# Patient Record
Sex: Female | Born: 1965 | Race: Black or African American | Hispanic: No | Marital: Married | Smoking: Never smoker
Health system: Southern US, Community
[De-identification: ages and names within clinical notes are randomized; demographics above are authoritative.]

## PROBLEM LIST (undated history)

## (undated) DIAGNOSIS — Z87898 Personal history of other specified conditions: Secondary | ICD-10-CM

## (undated) DIAGNOSIS — I38 Endocarditis, valve unspecified: Secondary | ICD-10-CM

## (undated) DIAGNOSIS — N159 Renal tubulo-interstitial disease, unspecified: Secondary | ICD-10-CM

## (undated) DIAGNOSIS — Q210 Ventricular septal defect: Secondary | ICD-10-CM

## (undated) DIAGNOSIS — E7800 Pure hypercholesterolemia, unspecified: Secondary | ICD-10-CM

## (undated) DIAGNOSIS — Z952 Presence of prosthetic heart valve: Secondary | ICD-10-CM

## (undated) DIAGNOSIS — N200 Calculus of kidney: Secondary | ICD-10-CM

## (undated) DIAGNOSIS — E785 Hyperlipidemia, unspecified: Secondary | ICD-10-CM

## (undated) DIAGNOSIS — R87629 Unspecified abnormal cytological findings in specimens from vagina: Secondary | ICD-10-CM

## (undated) HISTORY — DX: Presence of prosthetic heart valve: Z95.2

## (undated) HISTORY — DX: Pure hypercholesterolemia, unspecified: E78.00

## (undated) HISTORY — DX: Unspecified abnormal cytological findings in specimens from vagina: R87.629

## (undated) HISTORY — DX: Personal history of other specified conditions: Z87.898

## (undated) HISTORY — DX: Ventricular septal defect: Q21.0

## (undated) HISTORY — DX: Hyperlipidemia, unspecified: E78.5

## (undated) HISTORY — DX: Endocarditis, valve unspecified: I38

---
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
## 1997-05-21 ENCOUNTER — Encounter (HOSPITAL_COMMUNITY): Admission: RE | Admit: 1997-05-21 | Discharge: 1997-08-19 | Payer: Self-pay | Admitting: Cardiology

## 1997-09-02 ENCOUNTER — Encounter (HOSPITAL_COMMUNITY): Admission: RE | Admit: 1997-09-02 | Discharge: 1997-12-01 | Payer: Self-pay | Admitting: Cardiology

## 1997-11-20 ENCOUNTER — Encounter: Admission: RE | Admit: 1997-11-20 | Discharge: 1997-11-20 | Payer: Self-pay | Admitting: Family Medicine

## 1998-03-07 ENCOUNTER — Emergency Department (HOSPITAL_COMMUNITY): Admission: EM | Admit: 1998-03-07 | Discharge: 1998-03-07 | Payer: Self-pay | Admitting: Emergency Medicine

## 1998-04-10 ENCOUNTER — Emergency Department (HOSPITAL_COMMUNITY): Admission: EM | Admit: 1998-04-10 | Discharge: 1998-04-10 | Payer: Self-pay | Admitting: Emergency Medicine

## 1998-07-04 ENCOUNTER — Encounter: Payer: Self-pay | Admitting: Emergency Medicine

## 1998-07-04 ENCOUNTER — Emergency Department (HOSPITAL_COMMUNITY): Admission: EM | Admit: 1998-07-04 | Discharge: 1998-07-04 | Payer: Self-pay | Admitting: Emergency Medicine

## 1998-11-14 ENCOUNTER — Emergency Department (HOSPITAL_COMMUNITY): Admission: EM | Admit: 1998-11-14 | Discharge: 1998-11-14 | Payer: Self-pay | Admitting: Emergency Medicine

## 1998-12-14 ENCOUNTER — Encounter: Payer: Self-pay | Admitting: Emergency Medicine

## 1998-12-14 ENCOUNTER — Emergency Department (HOSPITAL_COMMUNITY): Admission: EM | Admit: 1998-12-14 | Discharge: 1998-12-15 | Payer: Self-pay | Admitting: Emergency Medicine

## 1999-01-27 ENCOUNTER — Encounter: Admission: RE | Admit: 1999-01-27 | Discharge: 1999-01-27 | Payer: Self-pay | Admitting: Family Medicine

## 1999-02-03 ENCOUNTER — Emergency Department (HOSPITAL_COMMUNITY): Admission: EM | Admit: 1999-02-03 | Discharge: 1999-02-04 | Payer: Self-pay

## 1999-02-10 ENCOUNTER — Encounter: Admission: RE | Admit: 1999-02-10 | Discharge: 1999-02-10 | Payer: Self-pay | Admitting: Sports Medicine

## 1999-05-23 ENCOUNTER — Emergency Department (HOSPITAL_COMMUNITY): Admission: EM | Admit: 1999-05-23 | Discharge: 1999-05-23 | Payer: Self-pay | Admitting: Emergency Medicine

## 1999-07-08 ENCOUNTER — Emergency Department (HOSPITAL_COMMUNITY): Admission: EM | Admit: 1999-07-08 | Discharge: 1999-07-08 | Payer: Self-pay | Admitting: Emergency Medicine

## 2000-01-06 ENCOUNTER — Emergency Department (HOSPITAL_COMMUNITY): Admission: EM | Admit: 2000-01-06 | Discharge: 2000-01-06 | Payer: Self-pay | Admitting: Emergency Medicine

## 2000-02-09 ENCOUNTER — Encounter: Admission: RE | Admit: 2000-02-09 | Discharge: 2000-02-09 | Payer: Self-pay | Admitting: Family Medicine

## 2000-04-20 ENCOUNTER — Encounter: Admission: RE | Admit: 2000-04-20 | Discharge: 2000-04-20 | Payer: Self-pay | Admitting: Family Medicine

## 2000-04-27 ENCOUNTER — Encounter: Admission: RE | Admit: 2000-04-27 | Discharge: 2000-04-27 | Payer: Self-pay | Admitting: Family Medicine

## 2000-05-18 ENCOUNTER — Encounter: Admission: RE | Admit: 2000-05-18 | Discharge: 2000-05-18 | Payer: Self-pay | Admitting: Family Medicine

## 2000-06-27 ENCOUNTER — Encounter: Admission: RE | Admit: 2000-06-27 | Discharge: 2000-06-27 | Payer: Self-pay | Admitting: Family Medicine

## 2001-06-30 ENCOUNTER — Emergency Department (HOSPITAL_COMMUNITY): Admission: EM | Admit: 2001-06-30 | Discharge: 2001-06-30 | Payer: Self-pay | Admitting: Emergency Medicine

## 2001-11-24 ENCOUNTER — Inpatient Hospital Stay (HOSPITAL_COMMUNITY): Admission: EM | Admit: 2001-11-24 | Discharge: 2001-11-26 | Payer: Self-pay | Admitting: Emergency Medicine

## 2001-12-01 ENCOUNTER — Emergency Department (HOSPITAL_COMMUNITY): Admission: EM | Admit: 2001-12-01 | Discharge: 2001-12-01 | Payer: Self-pay | Admitting: Emergency Medicine

## 2003-03-23 ENCOUNTER — Emergency Department (HOSPITAL_COMMUNITY): Admission: EM | Admit: 2003-03-23 | Discharge: 2003-03-23 | Payer: Self-pay | Admitting: Emergency Medicine

## 2003-07-12 ENCOUNTER — Emergency Department (HOSPITAL_COMMUNITY): Admission: EM | Admit: 2003-07-12 | Discharge: 2003-07-12 | Payer: Self-pay | Admitting: Emergency Medicine

## 2003-07-27 ENCOUNTER — Emergency Department (HOSPITAL_COMMUNITY): Admission: EM | Admit: 2003-07-27 | Discharge: 2003-07-27 | Payer: Self-pay | Admitting: Emergency Medicine

## 2003-07-28 ENCOUNTER — Emergency Department (HOSPITAL_COMMUNITY): Admission: EM | Admit: 2003-07-28 | Discharge: 2003-07-28 | Payer: Self-pay | Admitting: Emergency Medicine

## 2004-02-23 DIAGNOSIS — I38 Endocarditis, valve unspecified: Secondary | ICD-10-CM

## 2004-02-23 HISTORY — DX: Endocarditis, valve unspecified: I38

## 2004-07-07 ENCOUNTER — Ambulatory Visit: Payer: Self-pay | Admitting: Family Medicine

## 2004-07-07 ENCOUNTER — Other Ambulatory Visit: Admission: RE | Admit: 2004-07-07 | Discharge: 2004-07-07 | Payer: Self-pay | Admitting: Family Medicine

## 2004-09-06 ENCOUNTER — Emergency Department (HOSPITAL_COMMUNITY): Admission: EM | Admit: 2004-09-06 | Discharge: 2004-09-06 | Payer: Self-pay | Admitting: *Deleted

## 2004-10-09 ENCOUNTER — Ambulatory Visit: Payer: Self-pay | Admitting: Family Medicine

## 2004-11-13 ENCOUNTER — Emergency Department (HOSPITAL_COMMUNITY): Admission: EM | Admit: 2004-11-13 | Discharge: 2004-11-13 | Payer: Self-pay | Admitting: Family Medicine

## 2004-11-19 ENCOUNTER — Ambulatory Visit: Payer: Self-pay | Admitting: Family Medicine

## 2004-11-20 ENCOUNTER — Ambulatory Visit: Payer: Self-pay | Admitting: Family Medicine

## 2004-11-22 ENCOUNTER — Ambulatory Visit: Payer: Self-pay | Admitting: Internal Medicine

## 2004-11-22 ENCOUNTER — Inpatient Hospital Stay (HOSPITAL_COMMUNITY): Admission: EM | Admit: 2004-11-22 | Discharge: 2004-12-01 | Payer: Self-pay | Admitting: Emergency Medicine

## 2004-11-23 ENCOUNTER — Encounter (INDEPENDENT_AMBULATORY_CARE_PROVIDER_SITE_OTHER): Payer: Self-pay | Admitting: *Deleted

## 2004-11-25 ENCOUNTER — Encounter (INDEPENDENT_AMBULATORY_CARE_PROVIDER_SITE_OTHER): Payer: Self-pay | Admitting: Cardiology

## 2004-12-25 ENCOUNTER — Ambulatory Visit (HOSPITAL_COMMUNITY): Admission: RE | Admit: 2004-12-25 | Discharge: 2004-12-25 | Payer: Self-pay | Admitting: Cardiology

## 2005-01-05 ENCOUNTER — Ambulatory Visit (HOSPITAL_COMMUNITY): Admission: RE | Admit: 2005-01-05 | Discharge: 2005-01-05 | Payer: Self-pay | Admitting: Cardiology

## 2005-01-05 ENCOUNTER — Encounter (INDEPENDENT_AMBULATORY_CARE_PROVIDER_SITE_OTHER): Payer: Self-pay | Admitting: Cardiology

## 2005-02-02 ENCOUNTER — Emergency Department (HOSPITAL_COMMUNITY): Admission: EM | Admit: 2005-02-02 | Discharge: 2005-02-02 | Payer: Self-pay | Admitting: Emergency Medicine

## 2005-03-01 ENCOUNTER — Ambulatory Visit: Payer: Self-pay | Admitting: Family Medicine

## 2005-04-11 ENCOUNTER — Emergency Department (HOSPITAL_COMMUNITY): Admission: EM | Admit: 2005-04-11 | Discharge: 2005-04-11 | Payer: Self-pay | Admitting: Emergency Medicine

## 2005-07-04 ENCOUNTER — Emergency Department (HOSPITAL_COMMUNITY): Admission: EM | Admit: 2005-07-04 | Discharge: 2005-07-04 | Payer: Self-pay | Admitting: Emergency Medicine

## 2005-07-12 ENCOUNTER — Emergency Department (HOSPITAL_COMMUNITY): Admission: EM | Admit: 2005-07-12 | Discharge: 2005-07-12 | Payer: Self-pay | Admitting: Emergency Medicine

## 2005-07-13 ENCOUNTER — Ambulatory Visit: Payer: Self-pay | Admitting: Family Medicine

## 2006-07-08 ENCOUNTER — Emergency Department (HOSPITAL_COMMUNITY): Admission: EM | Admit: 2006-07-08 | Discharge: 2006-07-08 | Payer: Self-pay | Admitting: Emergency Medicine

## 2006-09-20 ENCOUNTER — Emergency Department (HOSPITAL_COMMUNITY): Admission: EM | Admit: 2006-09-20 | Discharge: 2006-09-20 | Payer: Self-pay | Admitting: Emergency Medicine

## 2006-11-23 ENCOUNTER — Ambulatory Visit: Payer: Self-pay | Admitting: Dentistry

## 2006-12-01 ENCOUNTER — Observation Stay (HOSPITAL_COMMUNITY): Admission: EM | Admit: 2006-12-01 | Discharge: 2006-12-02 | Payer: Self-pay | Admitting: Emergency Medicine

## 2006-12-02 ENCOUNTER — Encounter (INDEPENDENT_AMBULATORY_CARE_PROVIDER_SITE_OTHER): Payer: Self-pay | Admitting: Cardiovascular Disease

## 2006-12-07 ENCOUNTER — Encounter: Admission: RE | Admit: 2006-12-07 | Discharge: 2006-12-07 | Payer: Self-pay | Admitting: Dentistry

## 2007-01-23 ENCOUNTER — Ambulatory Visit: Payer: Self-pay | Admitting: Dentistry

## 2007-02-06 ENCOUNTER — Emergency Department (HOSPITAL_COMMUNITY): Admission: EM | Admit: 2007-02-06 | Discharge: 2007-02-07 | Payer: Self-pay | Admitting: Emergency Medicine

## 2007-04-13 ENCOUNTER — Encounter: Payer: Self-pay | Admitting: Family Medicine

## 2007-04-14 ENCOUNTER — Encounter: Payer: Self-pay | Admitting: Family Medicine

## 2007-04-17 ENCOUNTER — Encounter: Payer: Self-pay | Admitting: Family Medicine

## 2007-04-19 ENCOUNTER — Encounter: Payer: Self-pay | Admitting: Family Medicine

## 2007-04-28 ENCOUNTER — Encounter (INDEPENDENT_AMBULATORY_CARE_PROVIDER_SITE_OTHER): Payer: Self-pay | Admitting: Cardiology

## 2007-04-28 ENCOUNTER — Ambulatory Visit (HOSPITAL_COMMUNITY): Admission: RE | Admit: 2007-04-28 | Discharge: 2007-04-28 | Payer: Self-pay | Admitting: Cardiology

## 2007-05-30 ENCOUNTER — Encounter: Payer: Self-pay | Admitting: Family Medicine

## 2007-06-19 ENCOUNTER — Encounter: Admission: RE | Admit: 2007-06-19 | Discharge: 2007-06-19 | Payer: Self-pay | Admitting: Neurology

## 2007-08-05 ENCOUNTER — Emergency Department (HOSPITAL_COMMUNITY): Admission: EM | Admit: 2007-08-05 | Discharge: 2007-08-05 | Payer: Self-pay | Admitting: Emergency Medicine

## 2007-11-27 ENCOUNTER — Encounter: Payer: Self-pay | Admitting: Family Medicine

## 2008-02-09 ENCOUNTER — Other Ambulatory Visit: Admission: RE | Admit: 2008-02-09 | Discharge: 2008-02-09 | Payer: Self-pay | Admitting: Family Medicine

## 2008-02-09 ENCOUNTER — Ambulatory Visit: Payer: Self-pay | Admitting: Family Medicine

## 2008-02-09 DIAGNOSIS — R1909 Other intra-abdominal and pelvic swelling, mass and lump: Secondary | ICD-10-CM

## 2008-02-09 DIAGNOSIS — E785 Hyperlipidemia, unspecified: Secondary | ICD-10-CM

## 2008-02-09 LAB — CONVERTED CEMR LAB
ALT: 11 units/L (ref 0–35)
AST: 20 units/L (ref 0–37)
Bilirubin Urine: NEGATIVE
Bilirubin, Direct: 0.1 mg/dL (ref 0.0–0.3)
Cholesterol: 204 mg/dL (ref 0–200)
Eosinophils Absolute: 0.1 10*3/uL (ref 0.0–0.7)
GFR calc Af Amer: 102 mL/min
Glucose, Bld: 86 mg/dL (ref 70–99)
HCT: 33 % — ABNORMAL LOW (ref 36.0–46.0)
Monocytes Absolute: 0.3 10*3/uL (ref 0.1–1.0)
Monocytes Relative: 8.2 % (ref 3.0–12.0)
Nitrite: NEGATIVE
Platelets: 284 10*3/uL (ref 150–400)
Potassium: 3.6 meq/L (ref 3.5–5.1)
Protein, U semiquant: NEGATIVE
RDW: 15.5 % — ABNORMAL HIGH (ref 11.5–14.6)
Sodium: 137 meq/L (ref 135–145)
Total Bilirubin: 0.5 mg/dL (ref 0.3–1.2)
Urobilinogen, UA: NEGATIVE

## 2008-02-10 ENCOUNTER — Encounter: Payer: Self-pay | Admitting: Family Medicine

## 2008-02-12 ENCOUNTER — Encounter (INDEPENDENT_AMBULATORY_CARE_PROVIDER_SITE_OTHER): Payer: Self-pay | Admitting: *Deleted

## 2008-02-13 ENCOUNTER — Telehealth (INDEPENDENT_AMBULATORY_CARE_PROVIDER_SITE_OTHER): Payer: Self-pay | Admitting: *Deleted

## 2008-02-19 ENCOUNTER — Encounter: Admission: RE | Admit: 2008-02-19 | Discharge: 2008-02-19 | Payer: Self-pay | Admitting: Family Medicine

## 2008-02-19 ENCOUNTER — Encounter (INDEPENDENT_AMBULATORY_CARE_PROVIDER_SITE_OTHER): Payer: Self-pay | Admitting: *Deleted

## 2008-02-19 DIAGNOSIS — D259 Leiomyoma of uterus, unspecified: Secondary | ICD-10-CM | POA: Insufficient documentation

## 2008-02-20 ENCOUNTER — Encounter: Admission: RE | Admit: 2008-02-20 | Discharge: 2008-02-20 | Payer: Self-pay | Admitting: Family Medicine

## 2008-02-21 ENCOUNTER — Telehealth (INDEPENDENT_AMBULATORY_CARE_PROVIDER_SITE_OTHER): Payer: Self-pay | Admitting: *Deleted

## 2008-02-21 ENCOUNTER — Encounter (INDEPENDENT_AMBULATORY_CARE_PROVIDER_SITE_OTHER): Payer: Self-pay | Admitting: *Deleted

## 2008-02-27 ENCOUNTER — Encounter: Payer: Self-pay | Admitting: Family Medicine

## 2008-07-23 IMAGING — CT CT ABDOMEN W/O CM
2 of 4 series · 17 of 46 positions shown, 19 images · IV contrast (agent unspecified)
Comparison: None

ABDOMEN CT WITHOUT CONTRAST

CLINICAL DATA: Flank pain
TECHNIQUE: Multidetector CT imaging of the abdomen and pelvis was performed
following the standard renal stone protocol

Contrast:  None

[Series 3: abd/pelv w/o 5.0 b31f st · axial · non-contrast · 0.62mm/px · z∈[-463,-43]mm · 14 of 92 slices shown, 16 images]
[im 4/92  soft-tissue]
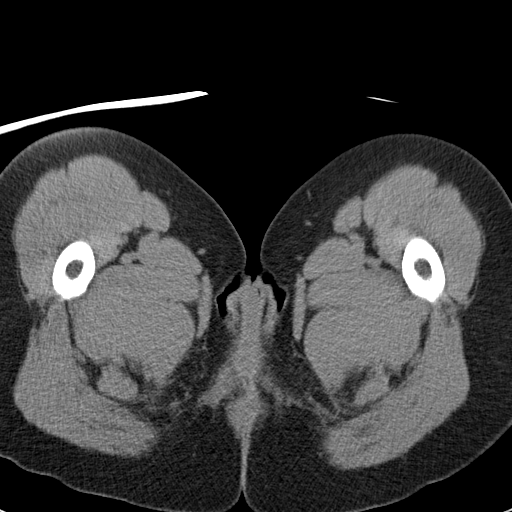
[im 4/92  bone]
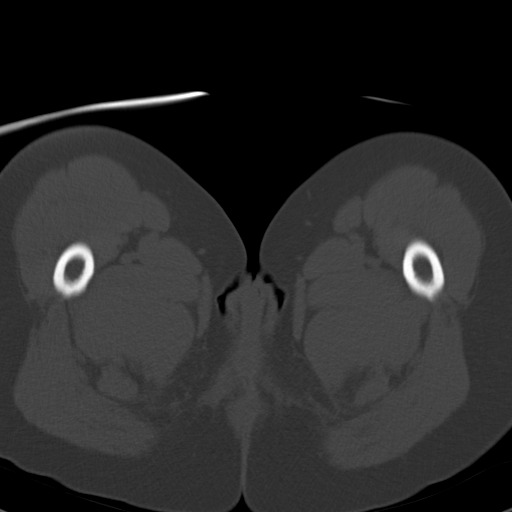
[im 11/92  soft-tissue]
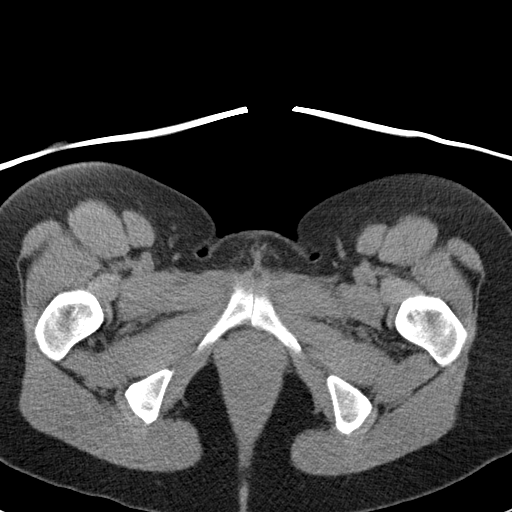
[im 19/92  soft-tissue]
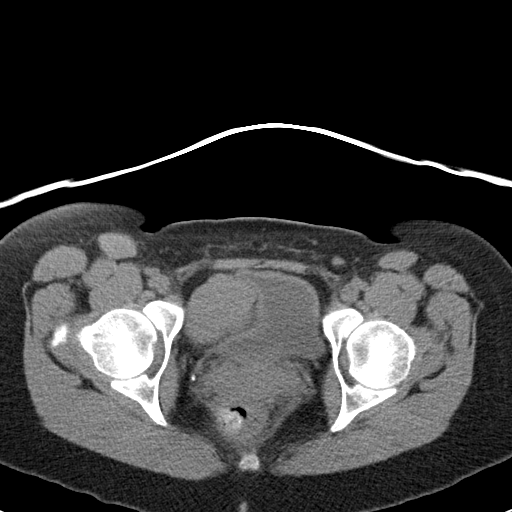
[im 26/92  soft-tissue]
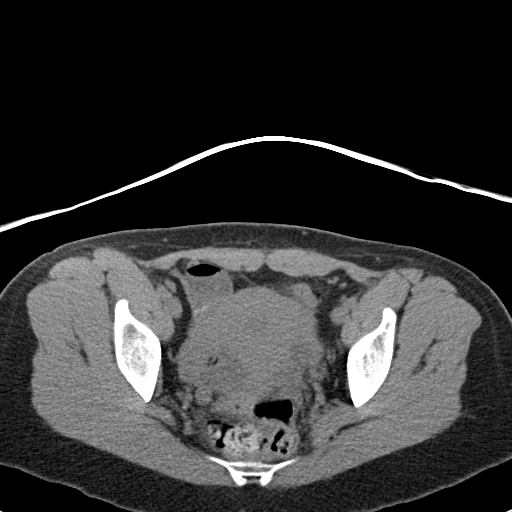
[im 30/92  soft-tissue]
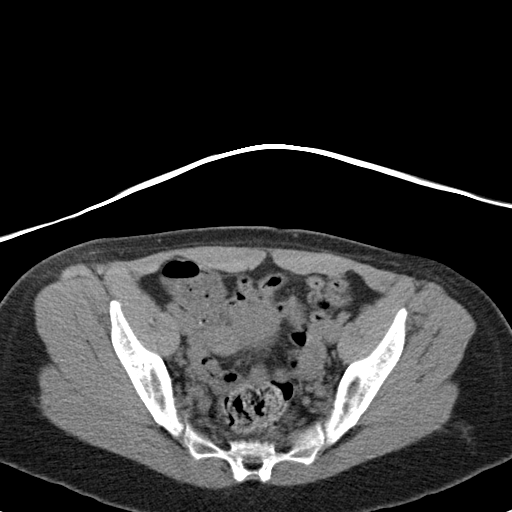
[im 37/92  soft-tissue]
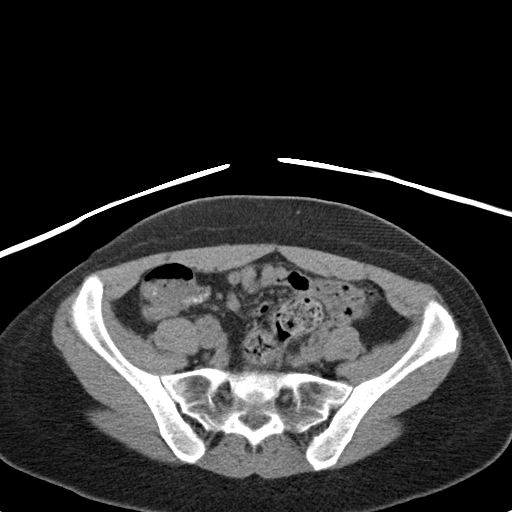
[im 44/92  soft-tissue]
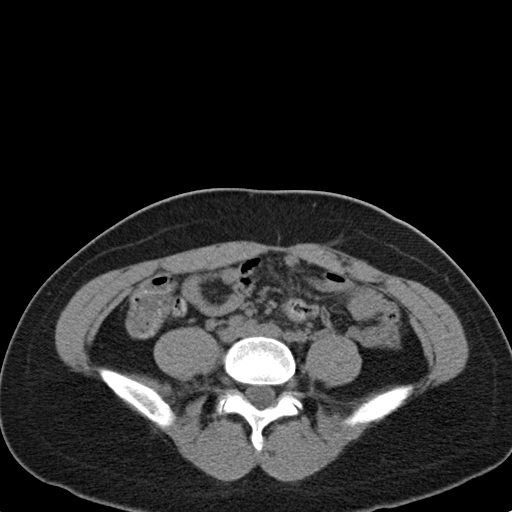
[im 48/92  soft-tissue]
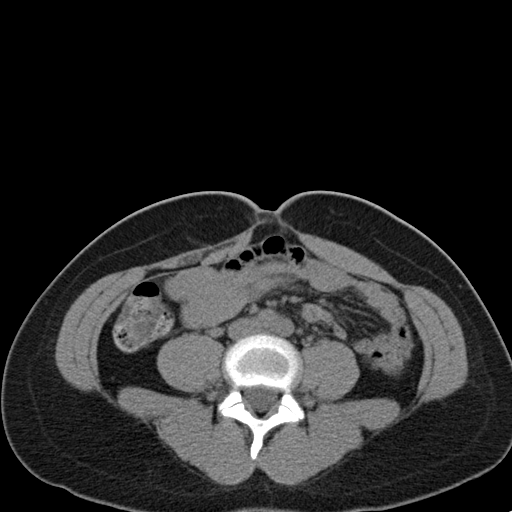
[im 55/92  soft-tissue]
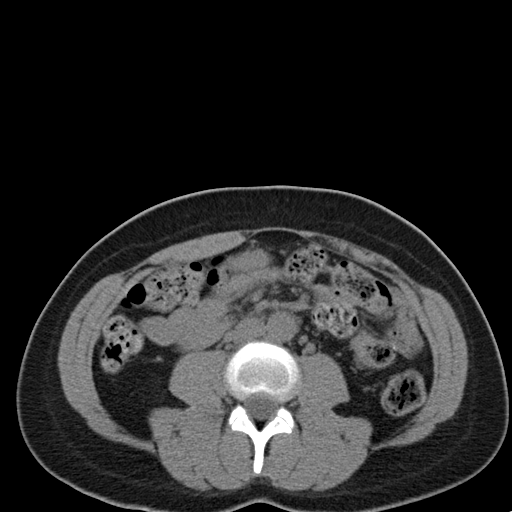
[im 55/92  bone]
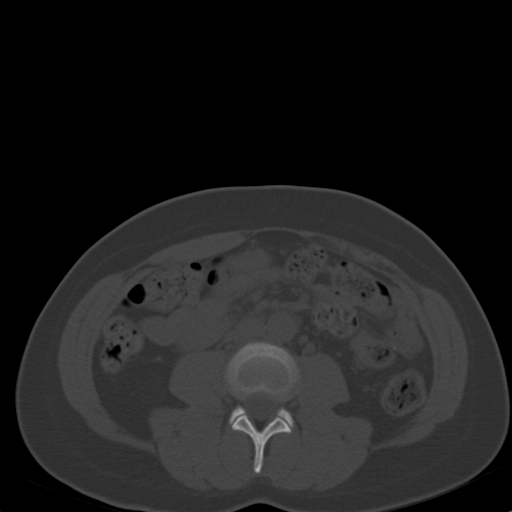
[im 62/92  soft-tissue]
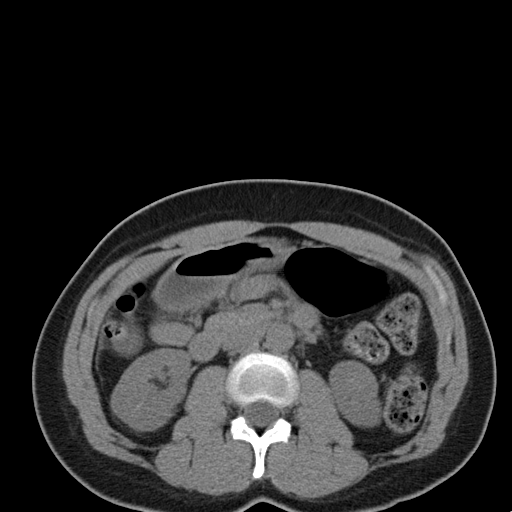
[im 70/92  soft-tissue]
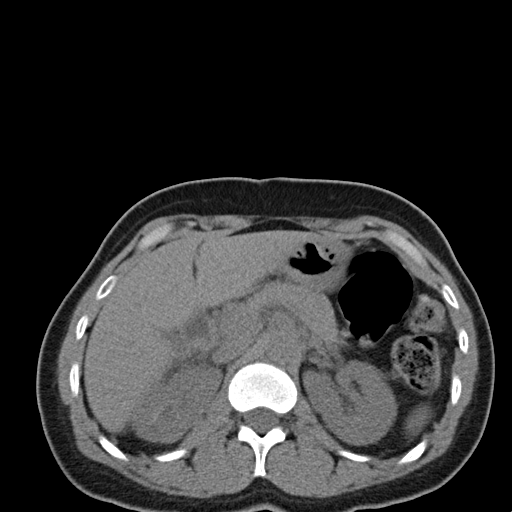
[im 73/92  soft-tissue]
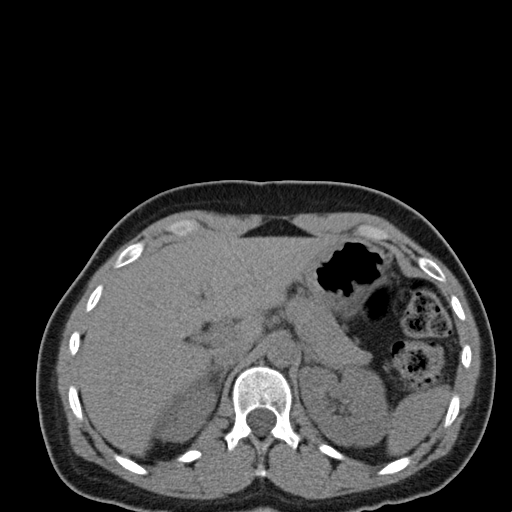
[im 81/92  soft-tissue]
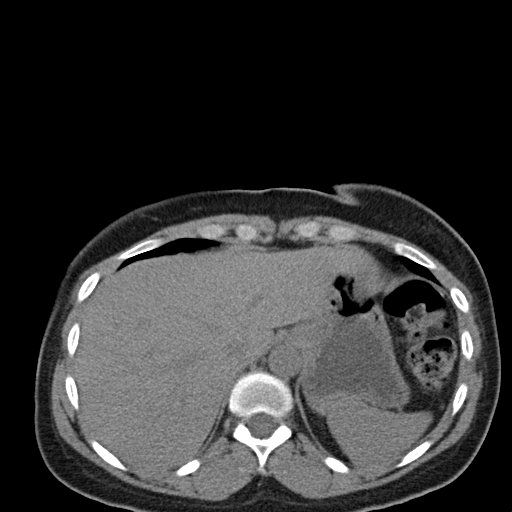
[im 88/92  soft-tissue]
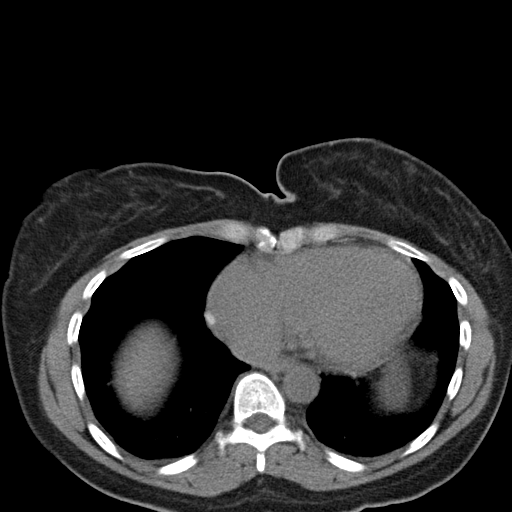

[Series 602: coronal abd · coronal · 0.90mm/px · 3 of 93 slices shown]
[im 31/93  soft-tissue]
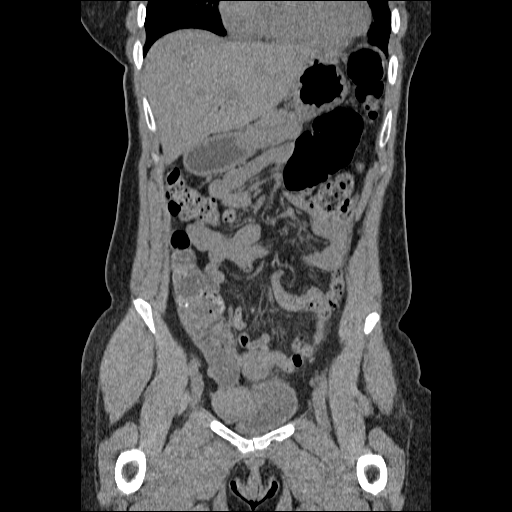
[im 41/93  soft-tissue]
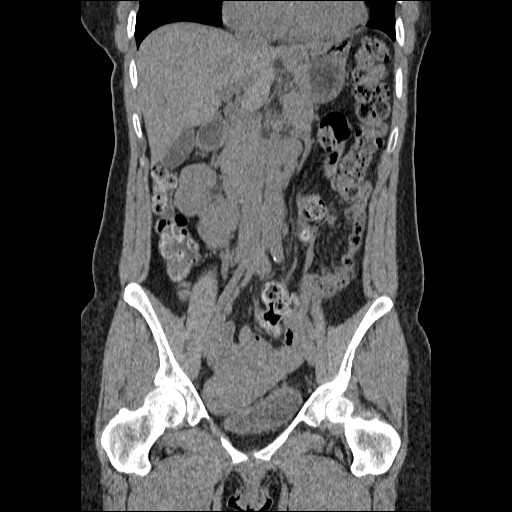
[im 52/93  soft-tissue]
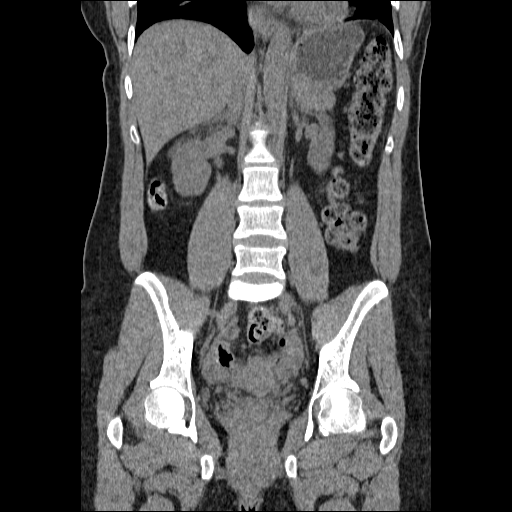

[17 of 46 positions shown; findings below may reference images not displayed]

FINDINGS: The lung bases are clear.

The liver is normal in attenuation and morphology.

Spleen normal.

Adrenal glands normal.

Pancreas normal.

The left kidney is negative. There is no hydronephrosis or nephrolithiasis.

There is a parenchymal calcification within the lower pole cortex of the right
kidney. No evidence for renal stones or hydronephrosis.

While the appendix is not localized as a separate entity there is no secondary
signs of acute appendicitis.

IMPRESSION

1.  No acute upper abdominal CT findings. Specifically there is no evidence for
hydronephrosis or nephrolithiasis.

PELVIS CT WITHOUT CONTRAST
FINDINGS: Punctate calcification along the course of the right ureter likely
represents a small phlebolith. A nonobstructing ureteral calculi is not excluded
but is considered less favored.

No evidence for hydroureter is identified.

No stones are identified within the urinary bladder.

Soft tissue mass within the right hemipelvis appears to arise from the anterior
lateral aspect of the uterus. This measures 5.1 x 3.9 cm,  image 72 and likely
represents a noncalcified uterine fibroid.
This exhibits mass-effect upon the urinary bladder.

There is no free fluid.

The visualized bowel loops of the pelvis are unremarkable.

No abnormal fluid collections.

Negative for lymphadenopathy.

IMPRESSION

1.  Soft tissue mass arises from the anterior aspect of the uterus is felt to
likely represent a large uterine fibroid.  This can be confirmed with a pelvic
sonography.
2.  Calcification within the right hemipelvis along the course of the right
ureter is felt to likely represent a phlebolith.  Nonobstructing ureteral stone
is less favored.

## 2008-09-20 ENCOUNTER — Encounter (INDEPENDENT_AMBULATORY_CARE_PROVIDER_SITE_OTHER): Payer: Self-pay | Admitting: Internal Medicine

## 2008-09-20 ENCOUNTER — Inpatient Hospital Stay (HOSPITAL_COMMUNITY): Admission: EM | Admit: 2008-09-20 | Discharge: 2008-09-22 | Payer: Self-pay | Admitting: Emergency Medicine

## 2008-09-22 HISTORY — PX: CARDIOVASCULAR STRESS TEST: SHX262

## 2008-11-22 HISTORY — PX: MECHANICAL AORTIC VALVE REPLACEMENT: SHX2013

## 2008-12-25 ENCOUNTER — Encounter: Payer: Self-pay | Admitting: Family Medicine

## 2008-12-25 HISTORY — PX: TRANSTHORACIC ECHOCARDIOGRAM: SHX275

## 2009-01-23 ENCOUNTER — Encounter: Payer: Self-pay | Admitting: Family Medicine

## 2009-02-24 ENCOUNTER — Ambulatory Visit: Payer: Self-pay | Admitting: Family Medicine

## 2009-03-28 ENCOUNTER — Ambulatory Visit: Payer: Self-pay | Admitting: Family Medicine

## 2009-03-28 ENCOUNTER — Other Ambulatory Visit: Admission: RE | Admit: 2009-03-28 | Discharge: 2009-03-28 | Payer: Self-pay | Admitting: Family Medicine

## 2009-03-28 DIAGNOSIS — Z954 Presence of other heart-valve replacement: Secondary | ICD-10-CM | POA: Insufficient documentation

## 2009-03-28 LAB — HM PAP SMEAR: HM Pap smear: NEGATIVE

## 2009-03-31 ENCOUNTER — Telehealth: Payer: Self-pay | Admitting: Family Medicine

## 2009-03-31 ENCOUNTER — Telehealth (INDEPENDENT_AMBULATORY_CARE_PROVIDER_SITE_OTHER): Payer: Self-pay | Admitting: *Deleted

## 2009-03-31 LAB — CONVERTED CEMR LAB
Albumin: 3.4 g/dL — ABNORMAL LOW (ref 3.5–5.2)
Alkaline Phosphatase: 60 units/L (ref 39–117)
CO2: 27 meq/L (ref 19–32)
Cholesterol: 179 mg/dL (ref 0–200)
Eosinophils Relative: 1.2 % (ref 0.0–5.0)
Glucose, Bld: 81 mg/dL (ref 70–99)
LDL Cholesterol: 116 mg/dL — ABNORMAL HIGH (ref 0–99)
MCV: 84 fL (ref 78.0–100.0)
Neutrophils Relative %: 34 % — ABNORMAL LOW (ref 43.0–77.0)
Potassium: 3.4 meq/L — ABNORMAL LOW (ref 3.5–5.1)
RDW: 15.5 % — ABNORMAL HIGH (ref 11.5–14.6)
Sodium: 139 meq/L (ref 135–145)
TSH: 1.56 microintl units/mL (ref 0.35–5.50)
Total CHOL/HDL Ratio: 3
VLDL: 10.6 mg/dL (ref 0.0–40.0)

## 2009-04-02 ENCOUNTER — Encounter (INDEPENDENT_AMBULATORY_CARE_PROVIDER_SITE_OTHER): Payer: Self-pay | Admitting: *Deleted

## 2009-04-02 LAB — CONVERTED CEMR LAB: Pap Smear: NEGATIVE

## 2009-04-04 ENCOUNTER — Emergency Department (HOSPITAL_COMMUNITY): Admission: EM | Admit: 2009-04-04 | Discharge: 2009-04-04 | Payer: Self-pay | Admitting: Emergency Medicine

## 2009-04-09 ENCOUNTER — Encounter: Admission: RE | Admit: 2009-04-09 | Discharge: 2009-04-09 | Payer: Self-pay | Admitting: Family Medicine

## 2009-04-09 LAB — HM MAMMOGRAPHY: HM Mammogram: NEGATIVE

## 2009-04-11 ENCOUNTER — Emergency Department (HOSPITAL_COMMUNITY): Admission: EM | Admit: 2009-04-11 | Discharge: 2009-04-12 | Payer: Self-pay | Admitting: Emergency Medicine

## 2009-05-01 ENCOUNTER — Ambulatory Visit: Payer: Self-pay | Admitting: Family Medicine

## 2009-05-01 DIAGNOSIS — G4700 Insomnia, unspecified: Secondary | ICD-10-CM

## 2009-07-11 ENCOUNTER — Emergency Department (HOSPITAL_COMMUNITY): Admission: EM | Admit: 2009-07-11 | Discharge: 2009-07-11 | Payer: Self-pay | Admitting: Emergency Medicine

## 2010-03-24 NOTE — Assessment & Plan Note (Signed)
Summary: discuss trouble sleeping, depression//fd   Vital Signs:  Patient profile:   45 year old female Weight:      126 pounds Temp:     98.2 degrees F oral Pulse rate:   78 / minute Pulse rhythm:   regular BP sitting:   126 / 82  (left arm) Cuff size:   regular  Vitals Entered By: Army Fossa CMA (May 01, 2009 3:46 PM) CC: Pt here to discuss getting medication to help her sleep, unable to sleep x 2 months   History of Present Illness: Pt here c/o trouble sleeping for 2 months.   Pt is separated and feels a little depressed.   Pt states they did not go for counseling but now she just feels like her mind is racing at night.   No other complaints.    Current Medications (verified): 1)  Warfarin Sodium 3 Mg Tabs (Warfarin Sodium) .... Take As Directed  Allergies: 1)  ! Codeine  Past History:  Past medical, surgical, family and social histories (including risk factors) reviewed for relevance to current acute and chronic problems.  Past Medical History: Reviewed history from 02/09/2008 and no changes required. aortic valve replacement 1987, hx abnormal pap CIN I, hx hypercholesterolemia, on disability, VSD repair 1979 Hyperlipidemia  Past Surgical History: Reviewed history from 02/09/2008 and no changes required. aortic valve replacement - 02/22/1985, VSD repair - 02/22/1977 Aortic Valve Replacement:   Family History: Reviewed history from 02/09/2008 and no changes required. none  Social History: Reviewed history from 02/09/2008 and no changes required. lives with ex-husband and children in [place].  No smoking, no etoh, on disability. Never Smoked Alcohol use-no Drug use-no Regular exercise-yes  Review of Systems      See HPI  Physical Exam  General:  Well-developed,well-nourished,in no acute distress; alert,appropriate and cooperative throughout examination Psych:  Oriented X3, normally interactive, good eye contact, not anxious appearing, and not depressed  appearing.     Impression & Recommendations:  Problem # 1:  INSOMNIA (ICD-780.52)  Discussed sleep hygiene.   Her updated medication list for this problem includes:    Lunesta 3 Mg Tabs (Eszopiclone)  Complete Medication List: 1)  Warfarin Sodium 3 Mg Tabs (Warfarin sodium) .... Take as directed 2)  Lunesta 3 Mg Tabs (Eszopiclone)

## 2010-03-24 NOTE — Assessment & Plan Note (Signed)
Summary: CPX....ADVISED NOSHOW FEE/RH.......Tonya Mcintosh   Vital Signs:  Patient profile:   45 year old female Height:      62 inches Weight:      132 pounds BMI:     24.23 Temp:     98.1 degrees F oral Pulse rate:   72 / minute Pulse rhythm:   regular BP sitting:   98 / 60  (left arm) Cuff size:   regular  Vitals Entered By: Army Fossa CMA (March 28, 2009 8:43 AM) CC: CPX, pap. no complaints    History of Present Illness: Pt here for cpe, labs and pap.  No complaints.    Preventive Screening-Counseling & Management  Alcohol-Tobacco     Alcohol drinks/day: 0     Smoking Status: never  Caffeine-Diet-Exercise     Caffeine use/day: 0     Does Patient Exercise: yes     Type of exercise: walk     Times/week: 2     Exercise Counseling: to improve exercise regimen  Hep-HIV-STD-Contraception     HIV Risk: no     Dental Visit-last 6 months no     Dental Care Counseling: to seek dental care; no dental care within six months     SBE monthly: yes     SBE Education/Counseling: not indicated; SBE done regularly  Safety-Violence-Falls     Seat Belt Use: 100      Sexual History:  currently monogamous and going through divorce.    Current Medications (verified): 1)  Warfarin Sodium 3 Mg Tabs (Warfarin Sodium) .... Take As Directed  Allergies (verified): 1)  ! Codeine  Past History:  Past Medical History: Last updated: 02/09/2008 aortic valve replacement 1987, hx abnormal pap CIN I, hx hypercholesterolemia, on disability, VSD repair 1979 Hyperlipidemia  Past Surgical History: Last updated: 02/09/2008 aortic valve replacement - 02/22/1985, VSD repair - 02/22/1977 Aortic Valve Replacement:   Family History: Last updated: 02/09/2008 none  Social History: Last updated: 02/09/2008 lives with ex-husband and children in [place].  No smoking, no etoh, on disability. Never Smoked Alcohol use-no Drug use-no Regular exercise-yes  Risk Factors: Alcohol Use: 0  (03/28/2009) Caffeine Use: 0 (03/28/2009) Exercise: yes (03/28/2009)  Risk Factors: Smoking Status: never (03/28/2009)  Family History: Reviewed history from 02/09/2008 and no changes required. none  Social History: Reviewed history from 02/09/2008 and no changes required. lives with ex-husband and children in [place].  No smoking, no etoh, on disability. Never Smoked Alcohol use-no Drug use-no Regular exercise-yes Caffeine use/day:  0 Dental Care w/in 6 mos.:  no Sexual History:  currently monogamous, going through divorce  Review of Systems      See HPI General:  Denies chills, fatigue, fever, loss of appetite, malaise, sleep disorder, sweats, weakness, and weight loss. Eyes:  Denies blurring, discharge, double vision, eye irritation, eye pain, halos, itching, light sensitivity, red eye, vision loss-1 eye, and vision loss-both eyes; optho- due--last one several years ago. ENT:  Denies decreased hearing, difficulty swallowing, ear discharge, earache, hoarseness, nasal congestion, nosebleeds, postnasal drainage, ringing in ears, sinus pressure, and sore throat. CV:  Denies bluish discoloration of lips or nails, chest pain or discomfort, difficulty breathing at night, difficulty breathing while lying down, fainting, fatigue, leg cramps with exertion, lightheadness, near fainting, palpitations, shortness of breath with exertion, swelling of feet, swelling of hands, and weight gain; Cardio-- was Dr Elsie Lincoln. Resp:  Denies chest discomfort, chest pain with inspiration, cough, coughing up blood, excessive snoring, hypersomnolence, morning headaches, pleuritic, shortness of breath, sputum  productive, and wheezing. GI:  Denies abdominal pain, bloody stools, change in bowel habits, constipation, dark tarry stools, diarrhea, excessive appetite, gas, hemorrhoids, indigestion, loss of appetite, nausea, vomiting, vomiting blood, and yellowish skin color. GU:  Denies abnormal vaginal bleeding, decreased  libido, discharge, dysuria, genital sores, hematuria, incontinence, nocturia, urinary frequency, and urinary hesitancy. MS:  Denies joint pain, joint redness, joint swelling, loss of strength, low back pain, mid back pain, muscle aches, muscle , cramps, muscle weakness, stiffness, and thoracic pain. Derm:  Denies changes in color of skin, changes in nail beds, dryness, excessive perspiration, flushing, hair loss, insect bite(s), itching, lesion(s), poor wound healing, and rash. Neuro:  Denies brief paralysis, difficulty with concentration, disturbances in coordination, falling down, headaches, inability to speak, memory loss, numbness, poor balance, seizures, sensation of room spinning, tingling, tremors, visual disturbances, and weakness. Psych:  Denies alternate hallucination ( auditory/visual), anxiety, depression, easily angered, easily tearful, irritability, mental problems, panic attacks, sense of great danger, suicidal thoughts/plans, thoughts of violence, unusual visions or sounds, and thoughts /plans of harming others. Endo:  Denies cold intolerance, excessive hunger, excessive thirst, excessive urination, heat intolerance, polyuria, and weight change. Heme:  Denies abnormal bruising, bleeding, enlarge lymph nodes, fevers, pallor, and skin discoloration. Allergy:  Denies hives or rash, itching eyes, persistent infections, seasonal allergies, and sneezing.  Physical Exam  General:  Well-developed,well-nourished,in no acute distress; alert,appropriate and cooperative throughout examination Head:  Normocephalic and atraumatic without obvious abnormalities. No apparent alopecia or balding. Eyes:  pupils equal, pupils round, and pupils reactive to light.   Ears:  External ear exam shows no significant lesions or deformities.  Otoscopic examination reveals clear canals, tympanic membranes are intact bilaterally without bulging, retraction, inflammation or discharge. Hearing is grossly normal  bilaterally. Nose:  External nasal examination shows no deformity or inflammation. Nasal mucosa are pink and moist without lesions or exudates. Mouth:  pharyngeal erythema and postnasal drip.   Neck:  No deformities, masses, or tenderness noted. Chest Wall:  No deformities, masses, or tenderness noted. Breasts:  No mass, nodules, thickening, tenderness, bulging, retraction, inflamation, nipple discharge or skin changes noted.   Lungs:  Normal respiratory effort, chest expands symmetrically. Lungs are clear to auscultation, no crackles or wheezes. Heart:  normal rate and Grade  2 /6 systolic ejection murmur.  + prosthetic valve Abdomen:  Bowel sounds positive,abdomen soft and non-tender without masses, organomegaly or hernias noted. Rectal:  No external abnormalities noted. Normal sphincter tone. No rectal masses or tenderness. heme neg brown stool Genitalia:  Pelvic Exam:        External: normal female genitalia without lesions or masses        Vagina: normal without lesions or masses        Cervix: normal without lesions or masses        Adnexa: normal bimanual exam without masses or fullness        Uterus: normal by palpation        Pap smear: performed Msk:  normal ROM, no joint tenderness, no joint swelling, no joint warmth, no redness over joints, no joint deformities, no joint instability, and no crepitation.   Pulses:  R posterior tibial normal, R dorsalis pedis normal, R carotid normal, L posterior tibial normal, L dorsalis pedis normal, and L carotid normal.   Extremities:  No clubbing, cyanosis, edema, or deformity noted with normal full range of motion of all joints.   Neurologic:  No cranial nerve deficits noted. Station and gait are normal. Plantar  reflexes are down-going bilaterally. DTRs are symmetrical throughout. Sensory, motor and coordinative functions appear intact. Skin:  Intact without suspicious lesions or rashes Cervical Nodes:  No lymphadenopathy noted Axillary  Nodes:  No palpable lymphadenopathy Psych:  Cognition and judgment appear intact. Alert and cooperative with normal attention span and concentration. No apparent delusions, illusions, hallucinations   Impression & Recommendations:  Problem # 1:  PREVENTIVE HEALTH CARE (ICD-V70.0)  Orders: Radiology Referral (Radiology) Venipuncture (04540) TLB-Lipid Panel (80061-LIPID) TLB-BMP (Basic Metabolic Panel-BMET) (80048-METABOL) TLB-CBC Platelet - w/Differential (85025-CBCD) TLB-Hepatic/Liver Function Pnl (80076-HEPATIC) TLB-TSH (Thyroid Stimulating Hormone) (84443-TSH) EKG w/ Interpretation (93000)  Problem # 2:  HYPERLIPIDEMIA (ICD-272.4)  Orders: Venipuncture (98119) TLB-Lipid Panel (80061-LIPID) TLB-BMP (Basic Metabolic Panel-BMET) (80048-METABOL) TLB-CBC Platelet - w/Differential (85025-CBCD) TLB-Hepatic/Liver Function Pnl (80076-HEPATIC) TLB-TSH (Thyroid Stimulating Hormone) (84443-TSH) EKG w/ Interpretation (93000)  Problem # 3:  AORTIC VALVE REPLACEMENT, HX OF (ICD-V43.3)  per cardiology  Orders: Venipuncture (14782) TLB-Lipid Panel (80061-LIPID) TLB-BMP (Basic Metabolic Panel-BMET) (80048-METABOL) TLB-CBC Platelet - w/Differential (85025-CBCD) TLB-Hepatic/Liver Function Pnl (80076-HEPATIC) TLB-TSH (Thyroid Stimulating Hormone) (84443-TSH) EKG w/ Interpretation (93000)  Complete Medication List: 1)  Warfarin Sodium 3 Mg Tabs (Warfarin sodium) .... Take as directed   EKG  Procedure date:  03/28/2009  Findings:      Sinus bradycardia with rate of:  58 PVC's noted.      Immunization History:  Tetanus/Td Immunization History:    Tetanus/Td:  Historical (04/10/2003)   Flu Vaccine Result Date:  03/28/2009 Flu Vaccine Next Due:  Refused

## 2010-03-24 NOTE — Progress Notes (Signed)
Summary: QUESTIONS ON STOOL KIT---PLEASE CALL TODAY  Phone Note Call from Patient Call back at [phone]--DAUGHTER'S NUMBER   Caller: Patient Summary of Call: HAS QUESTIONS HOW TO DO THE STOOL CARD KIT---PLEASE CALL DAUGHTER'S NUMBER Initial call taken by: Jerolyn Shin,  March 31, 2009 3:40 PM  Follow-up for Phone Call        left message to call office..............Marland KitchenFelecia Deloach CMA  March 31, 2009 4:22 PM   Additional Follow-up for Phone Call Additional follow up Details #1::        left message to call office................Marland KitchenFelecia Deloach CMA  April 01, 2009 9:17 AM     Additional Follow-up for Phone Call Additional follow up Details #2::    called pt daughter back, pt no longer needs help with matter.........................Marland KitchenFelecia Deloach CMA  April 02, 2009 1:53 PM

## 2010-03-24 NOTE — Progress Notes (Signed)
Summary: Lab Results - FYI  Phone Note Outgoing Call   Call placed by: Army Fossa CMA,  March 31, 2009 4:57 PM Summary of Call: Regarding lab results, LMTCB:   Pt is anemic ---- needs to take iron two times a day ---recheck 1 month---285.9  ibc, ferritin, cbcd, b12 pt was also supposed to f/u gyn this summer ---- did she?--- she needs f/u fibroid and bleeding rest of labs ok  Left message to call back, (2/9) Pap Smear Results: + Trich ---flagyl 500 mg two times a day for 7 days  Signed by Loreen Freud DO on 04/01/2009 at 11:54 PM     Follow-up for Phone Call        LMTCB. Army Fossa CMA  April 09, 2009 1:54 PM   Additional Follow-up for Phone Call Additional follow up Details #1::        Pt is aware- she states she went to the ER and they treated the Trich, so she does not need medicaiton for that. She also did not see the gyn this summer, she put her daughter on the phone and I informed her that she needed to f/u with them and she understood.Army Fossa CMA  April 09, 2009 2:27 PM

## 2010-03-24 NOTE — Letter (Signed)
Summary: Primary Care Consult Scheduled Letter  [facility] [place]  [address] [place], [place] [address]   Phone: [phone]  Fax: [phone]      04/02/2009 MRN: [id number]  [facility] [address] [place], [place]  [address]    Dear Ms. [facility],    We have scheduled an appointment for you.  At the recommendation of Dr. Loreen Freud, we have scheduled you for a Screening Mammogram with [facility] on 04-09-2009 at 12:40pm.  Their address is [address], [address], [place] [address]. The office phone number is [phone].  If this appointment day and time is not convenient for you, please feel free to call the office of the doctor you are being referred to at the number listed above and reschedule the appointment.    It is important for you to keep your scheduled appointments. We are here to make sure you are given good patient care.   Thank you,    Renee, Patient Care Coordinator [facility]

## 2010-05-01 ENCOUNTER — Emergency Department (HOSPITAL_COMMUNITY)
Admission: EM | Admit: 2010-05-01 | Discharge: 2010-05-01 | Disposition: A | Discharge status: Home / Self Care | Payer: Medicaid Other | Attending: Emergency Medicine | Admitting: Emergency Medicine

## 2010-05-01 DIAGNOSIS — J3489 Other specified disorders of nose and nasal sinuses: Secondary | ICD-10-CM | POA: Insufficient documentation

## 2010-05-01 DIAGNOSIS — Z7901 Long term (current) use of anticoagulants: Secondary | ICD-10-CM | POA: Insufficient documentation

## 2010-05-01 DIAGNOSIS — J069 Acute upper respiratory infection, unspecified: Secondary | ICD-10-CM | POA: Insufficient documentation

## 2010-05-01 DIAGNOSIS — R1310 Dysphagia, unspecified: Secondary | ICD-10-CM | POA: Insufficient documentation

## 2010-05-01 DIAGNOSIS — R490 Dysphonia: Secondary | ICD-10-CM | POA: Insufficient documentation

## 2010-05-01 DIAGNOSIS — H9209 Otalgia, unspecified ear: Secondary | ICD-10-CM | POA: Insufficient documentation

## 2010-05-01 DIAGNOSIS — R0989 Other specified symptoms and signs involving the circulatory and respiratory systems: Secondary | ICD-10-CM | POA: Insufficient documentation

## 2010-05-01 DIAGNOSIS — Z86718 Personal history of other venous thrombosis and embolism: Secondary | ICD-10-CM | POA: Insufficient documentation

## 2010-05-01 DIAGNOSIS — R070 Pain in throat: Secondary | ICD-10-CM | POA: Insufficient documentation

## 2010-05-01 DIAGNOSIS — Z950 Presence of cardiac pacemaker: Secondary | ICD-10-CM | POA: Insufficient documentation

## 2010-05-01 DIAGNOSIS — R05 Cough: Secondary | ICD-10-CM | POA: Insufficient documentation

## 2010-05-01 DIAGNOSIS — R059 Cough, unspecified: Secondary | ICD-10-CM | POA: Insufficient documentation

## 2010-05-01 LAB — RAPID STREP SCREEN (MED CTR MEBANE ONLY): Streptococcus, Group A Screen (Direct): NEGATIVE

## 2010-05-02 LAB — STREP A DNA PROBE

## 2010-05-11 LAB — URINALYSIS, ROUTINE W REFLEX MICROSCOPIC: Ketones, ur: NEGATIVE mg/dL

## 2010-05-11 LAB — URINE MICROSCOPIC-ADD ON

## 2010-05-13 LAB — GC/CHLAMYDIA PROBE AMP, GENITAL
Chlamydia, DNA Probe: NEGATIVE
GC Probe Amp, Genital: NEGATIVE

## 2010-05-13 LAB — URINALYSIS, ROUTINE W REFLEX MICROSCOPIC
Bilirubin Urine: NEGATIVE
Glucose, UA: NEGATIVE mg/dL
Ketones, ur: NEGATIVE mg/dL
Nitrite: NEGATIVE
Protein, ur: NEGATIVE mg/dL
Protein, ur: NEGATIVE mg/dL
Specific Gravity, Urine: 1.009 (ref 1.005–1.030)
Specific Gravity, Urine: 1.013 (ref 1.005–1.030)
Urobilinogen, UA: 0.2 mg/dL (ref 0.0–1.0)
pH: 6 (ref 5.0–8.0)
pH: 8.5 — ABNORMAL HIGH (ref 5.0–8.0)

## 2010-05-13 LAB — URINE MICROSCOPIC-ADD ON

## 2010-05-13 LAB — PROTIME-INR
INR: 2.48 — ABNORMAL HIGH (ref 0.00–1.49)
Prothrombin Time: 26.6 seconds — ABNORMAL HIGH (ref 11.6–15.2)

## 2010-05-13 LAB — POCT I-STAT, CHEM 8
Creatinine, Ser: 0.9 mg/dL (ref 0.4–1.2)
HCT: 33 % — ABNORMAL LOW (ref 36.0–46.0)
Hemoglobin: 11.2 g/dL — ABNORMAL LOW (ref 12.0–15.0)
Potassium: 3.8 mEq/L (ref 3.5–5.1)
Sodium: 139 mEq/L (ref 135–145)
TCO2: 23 mmol/L (ref 0–100)

## 2010-05-13 LAB — DIFFERENTIAL
Basophils Relative: 0 % (ref 0–1)
Eosinophils Absolute: 0 10*3/uL (ref 0.0–0.7)
Lymphs Abs: 1.2 10*3/uL (ref 0.7–4.0)
Neutro Abs: 3.9 10*3/uL (ref 1.7–7.7)
Neutrophils Relative %: 71 % (ref 43–77)

## 2010-05-13 LAB — URINE CULTURE: Colony Count: >=100000

## 2010-05-13 LAB — POCT PREGNANCY, URINE: Preg Test, Ur: NEGATIVE

## 2010-05-13 LAB — CBC
MCV: 81.7 fL (ref 78.0–100.0)
Platelets: 243 10*3/uL (ref 150–400)
WBC: 5.6 10*3/uL (ref 4.0–10.5)

## 2010-05-20 ENCOUNTER — Other Ambulatory Visit: Payer: Self-pay | Admitting: Family Medicine

## 2010-05-20 DIAGNOSIS — Z1231 Encounter for screening mammogram for malignant neoplasm of breast: Secondary | ICD-10-CM

## 2010-05-26 ENCOUNTER — Ambulatory Visit: Payer: Medicaid Other

## 2010-05-31 LAB — CULTURE, BLOOD (ROUTINE X 2): Culture: NO GROWTH

## 2010-05-31 LAB — CBC
HCT: 33.8 % — ABNORMAL LOW (ref 36.0–46.0)
Hemoglobin: 11.2 g/dL — ABNORMAL LOW (ref 12.0–15.0)
Hemoglobin: 9.9 g/dL — ABNORMAL LOW (ref 12.0–15.0)
MCHC: 33 g/dL (ref 30.0–36.0)
MCV: 86.6 fL (ref 78.0–100.0)
Platelets: 219 10*3/uL (ref 150–400)
RBC: 3.91 MIL/uL (ref 3.87–5.11)
RDW: 17 % — ABNORMAL HIGH (ref 11.5–15.5)

## 2010-05-31 LAB — HEMOGLOBIN A1C
Hgb A1c MFr Bld: 5.5 % (ref 4.6–6.1)
Mean Plasma Glucose: 111 mg/dL

## 2010-05-31 LAB — BASIC METABOLIC PANEL
CO2: 26 mEq/L (ref 19–32)
Chloride: 107 mEq/L (ref 96–112)
GFR calc Af Amer: >60 mL/min (ref >60)
Sodium: 138 mEq/L (ref 135–145)

## 2010-05-31 LAB — TSH: TSH: 3.931 u[IU]/mL (ref 0.350–4.500)

## 2010-05-31 LAB — POCT CARDIAC MARKERS
CKMB, poc: <1 ng/mL — ABNORMAL LOW (ref 1.0–8.0)
Troponin i, poc: <0.05 ng/mL (ref 0.00–0.09)
Troponin i, poc: <0.05 ng/mL (ref 0.00–0.09)

## 2010-05-31 LAB — COMPREHENSIVE METABOLIC PANEL
Alkaline Phosphatase: 59 U/L (ref 39–117)
BUN: 4 mg/dL — ABNORMAL LOW (ref 6–23)
Chloride: 107 mEq/L (ref 96–112)
Glucose, Bld: 118 mg/dL — ABNORMAL HIGH (ref 70–99)
Potassium: 3.4 mEq/L — ABNORMAL LOW (ref 3.5–5.1)
Total Bilirubin: 0.6 mg/dL (ref 0.3–1.2)
Total Protein: 7.5 g/dL (ref 6.0–8.3)

## 2010-05-31 LAB — CARDIAC PANEL(CRET KIN+CKTOT+MB+TROPI)
CK, MB: 0.7 ng/mL (ref 0.3–4.0)
CK, MB: 0.7 ng/mL (ref 0.3–4.0)
Relative Index: 0.6 (ref 0.0–2.5)

## 2010-05-31 LAB — LIPID PANEL
Cholesterol: 174 mg/dL (ref 0–200)
LDL Cholesterol: 117 mg/dL — ABNORMAL HIGH (ref 0–99)

## 2010-05-31 LAB — DIFFERENTIAL
Basophils Relative: 1 % (ref 0–1)
Eosinophils Absolute: 0.1 10*3/uL (ref 0.0–0.7)
Monocytes Absolute: 0.5 10*3/uL (ref 0.1–1.0)
Monocytes Relative: 9 % (ref 3–12)

## 2010-05-31 LAB — PROTIME-INR: INR: 2.9 — ABNORMAL HIGH (ref 0.00–1.49)

## 2010-05-31 LAB — SEDIMENTATION RATE: Sed Rate: 21 mm/hr (ref 0–22)

## 2010-07-07 NOTE — Discharge Summary (Signed)
[name], [name]          ACCOUNT NO.:  [id number]   MEDICAL RECORD NO.:  [id number]          PATIENT TYPE:  INP   LOCATION:  2020                         FACILITY:  [facility]   PHYSICIAN:  Ritta Slot, MD     DATE OF BIRTH:  09-22-1965   DATE OF ADMISSION:  12/01/2006  DATE OF DISCHARGE:  12/02/2006                               DISCHARGE SUMMARY   [name] is a 45 year old African-American female patient of Dr.  Truett Perna, who has a Medtronic call prosthetic valve for aortic  insufficiency, after undergoing ventricular septal defect and AV repair  back in 1979.  Her prosthetic device was placed in 1987.  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  She came into the emergency  room with complaints of chest pain and increased shortness of breath.  She has had this similar chest pain in the past, but it never lasted as  long as it did this time.  It awakened her 4:00 a.m.  It was severe.  She described it as a 10/10.  She was seen in the emergency room by Dr.  Tresa Endo.  It was thought that her pain was musculoskeletal.  She was very  sore and palpations.  It hurt was worse when she took a deep breath or  if she laid down flat.  Thus, it was decided to check an echocardiogram  to rule out any pericardial effusion, if it was a pericarditis.  On  December 02, 2006, she underwent 2-D echo, which did not show any  pericardial effusion.  Her her valve looked okay.  Her CK-MBs and  troponins were all negative.  Her INR was 2.8, total cholesterol was  181, LDL was 117, HDL was 58, triglycerides were 31, TSH was 3.163, her  D-dimer was less than 0.22.  She received some Toradol and also some  Dilaudid, and on the morning of December 02, 2006 she was not in the pain  anymore.  She also complained on admission about inability to eat,  because her mouth hurt.  She states she had broken a molar off the  back  of her mouth, because of her anticoagulation status.  Dr. gamble had  told her in the past that she would need hospitalization for any  extensive dental work.  Thus, a dental consult was called.  Orthopantogram was ordered.  I spoke to Dr. Birdie Sons, who will review  the films and call her at home.  She was seen by Dr. Lynnea Ferrier on December 02, 2006, considered stable for discharge home.  Blood pressure is  94/61, pulse is 52, respirations were 18.  Temperature was 97.7, O2 sats  are 100%.   DISCHARGE MEDICATIONS:  Will be Coumadin 3 mg, 1-1/2 tabs on Monday,  Wednesday, Friday, 1 tab on all other days, Prilosec 20 mg a day x1  month.  She can but that over-the-counter.  She was told she can take  Tylenol, ibuprofen for chest pain symptoms.  His chest x-ray showed no  acute cardiac abnormality.   DISCHARGE DIAGNOSIS:  1. Chest pain thought to be musculoskeletal.  His CK-MBs were negative      x3, and troponins negative x3.  2. Indigestion.  Place on Prilosec x1 month.  3. Medtronic-Hall aortic valve replacement.  4. 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  5. Tooth fracture seen, dental consult called while in the hospital.      Orthopantogram was ordered.  Dr. Birdie Sons to follow up as an      outpatient.  She would need admission in the future, if has not      need of any surgical extraction for any extensive dental work in      the future.      [name], N.P.      Ritta Slot, MD  Electronically Signed    BB/MEDQ  D:  12/02/2006  T:  12/03/2006  Job:  [id number]   cc:   Lelon Perla, DO  Ritta Slot, MD  Delbert Harness, MD

## 2010-07-07 NOTE — H&P (Signed)
Tonya Mcintosh, Tonya Mcintosh          ACCOUNT NO.:  [id number]   MEDICAL RECORD NO.:  [id number]          PATIENT TYPE:  EMS   LOCATION:  MAJO                         FACILITY:  [facility]   PHYSICIAN:  Michiel Cowboy, MDDATE OF BIRTH:  1965-11-02   DATE OF ADMISSION:  09/19/2008  DATE OF DISCHARGE:                              HISTORY & PHYSICAL   PRIMARY CARE PHYSICIAN:  Lelon Perla, DO.   CHIEF COMPLAINT:  Chest pain.   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  She since  then has had at least one admission for chest pain which was thought to  be musculoskeletal.  Presents again today with complaints of 3-day  history of chest pain.  No shortness of breath.  The chest pain is  exacerbated by her pressing on her chest, moving about the same as the  sense of exertion, but a sense of turning or twisting her trunk.  She  denies lifting anything heavy.  She does have a granddaughter but says  that the baby is fairly light.  She has not done any unusual activity.  She has not had any fevers or chills.  No swelling of her legs.  No  shortness of breath.  No orthopnea.  The pain is almost continuous.  Does not seem to be pleuritic in nature.  She did have a CT angiogram  done in the ED which was unremarkable.  First set of cardiac enzymes  also unremarkable.  The patient is being admitted for further  evaluation.   PAST MEDICAL HISTORY:  Past medical history significant for aortic valve  repair on Coumadin.  The patient seems to be on impression that she had  a blood clot in the past, although do not see that to be documented in  the records.   SOCIAL HISTORY:  The patient used to smoke  but not currently.  Does not  drink, does not abuse drugs.   FAMILY HISTORY:  Noncontributory.   ALLERGIES:  ASPIRIN, CODEINE.   MEDICATIONS:  Coumadin 4.5 mg daily.   REVIEW OF SYSTEMS:  Otherwise review of systems as per HPI, otherwise  unremarkable.   PHYSICAL EXAMINATION:  VITALS:  Temperature 97.7, blood pressure 107/69,  pulse 63, but then drifted down to the high 40s, respirations 20,  satting 99% on room air.  GENERAL:  The patient appears to be in no acute distress.  HEENT: Head nontraumatic.  Moist mucous membranes.  LUNGS:  Clear to auscultation bilaterally.  HEART:  There is a definite sharp aortic sound, but also there appears  to be a fairly loud 3/6 murmur which perhaps is diastolic in nature.  LOWER EXTREMITIES:  Without clubbing, cyanosis or edema.  ABDOMEN:  Soft, nontender, nondistended.Marland Kitchen  NEUROLOGIC:  The patient is intact.  CHEST WALL:  There is a tenderness to the chest wall which  is easily  reproducible and she says that this is very similar to the chest pain  she is feeling.  SKIN:  Clean, dry and intact.   LABORATORY DATA:  White blood cell count 4.9, hemoglobin 0.2, sodium 38,  potassium 3.2, creatinine 0.89.  Cardiac enzymes negative.  Blood  cultures pending.  Chest x-ray unremarkable for any acute findings.  CT  angiogram of chest negative for PE, status post aortic valve replacement  and minimal atelectasis noted.  INR 2.3.   EKG showing no ischemic changes.  Bradycardia, rate 54.  Of note, her  EKG in the past also showing bradycardia, rate of 55.  Per the patient,  she has had history of bradycardia in the past.   ASSESSMENT/PLAN:  1. Chest pain.  Seems to be rather more musculoskeletal but given her      history, would need to obtain 2-D echo to check on aortic valve.      Blood cultures are currently pending.  She is not having any fevers      or chills.  She does not have elevated white blood cell count.      Will check sed rates.  She  does have history of endocarditis but      this does not seem to be typical for this.  Pain is fairly clearly      reproducible.  Will cycle cardiac markers for completion.  Check      fasting lipid panel and hemoglobin A1c.  Check TSH given      bradycardia which seems to be chronic.  A 2-D echo will also help      [place] determine if she is having any evidence of pericarditis.  EKGs      not indicative of that.  Symptoms seem to be somewhat atypical for      pericarditis as well.  2. Bradycardia per records, appears to be chronic.  3. Low potassium, replace.   1. Aortic valve repair.  Continue Coumadin.  Consider official      cardiology consult as the patient has been very closely monitored      by them in the past day.  They should be aware of her being here.  2. Prophylaxis:  Protonix plus Coumadin.      Michiel Cowboy, MD  Electronically Signed     AVD/MEDQ  D:  09/20/2008  T:  09/20/2008  Job:  [id number]   cc:   Lelon Perla, DO  Madaline Savage, M.D.

## 2010-07-08 ENCOUNTER — Emergency Department (HOSPITAL_COMMUNITY): Payer: Medicaid Other

## 2010-07-08 ENCOUNTER — Emergency Department (HOSPITAL_COMMUNITY)
Admission: EM | Admit: 2010-07-08 | Discharge: 2010-07-08 | Disposition: A | Discharge status: Home / Self Care | Payer: Medicaid Other | Attending: Emergency Medicine | Admitting: Emergency Medicine

## 2010-07-08 DIAGNOSIS — R05 Cough: Secondary | ICD-10-CM | POA: Insufficient documentation

## 2010-07-08 DIAGNOSIS — Z950 Presence of cardiac pacemaker: Secondary | ICD-10-CM | POA: Insufficient documentation

## 2010-07-08 DIAGNOSIS — R0989 Other specified symptoms and signs involving the circulatory and respiratory systems: Secondary | ICD-10-CM | POA: Insufficient documentation

## 2010-07-08 DIAGNOSIS — D259 Leiomyoma of uterus, unspecified: Secondary | ICD-10-CM | POA: Insufficient documentation

## 2010-07-08 DIAGNOSIS — R079 Chest pain, unspecified: Secondary | ICD-10-CM | POA: Insufficient documentation

## 2010-07-08 DIAGNOSIS — Z86711 Personal history of pulmonary embolism: Secondary | ICD-10-CM | POA: Insufficient documentation

## 2010-07-08 DIAGNOSIS — Z954 Presence of other heart-valve replacement: Secondary | ICD-10-CM | POA: Insufficient documentation

## 2010-07-08 DIAGNOSIS — Z7901 Long term (current) use of anticoagulants: Secondary | ICD-10-CM | POA: Insufficient documentation

## 2010-07-08 DIAGNOSIS — N12 Tubulo-interstitial nephritis, not specified as acute or chronic: Secondary | ICD-10-CM | POA: Insufficient documentation

## 2010-07-08 DIAGNOSIS — R0609 Other forms of dyspnea: Secondary | ICD-10-CM | POA: Insufficient documentation

## 2010-07-08 DIAGNOSIS — R059 Cough, unspecified: Secondary | ICD-10-CM | POA: Insufficient documentation

## 2010-07-08 LAB — URINALYSIS, ROUTINE W REFLEX MICROSCOPIC
Glucose, UA: NEGATIVE mg/dL
Ketones, ur: NEGATIVE mg/dL
Protein, ur: 30 mg/dL — AB

## 2010-07-08 LAB — URINE MICROSCOPIC-ADD ON

## 2010-07-08 LAB — PROTIME-INR: Prothrombin Time: 30 seconds — ABNORMAL HIGH (ref 11.6–15.2)

## 2010-07-10 NOTE — Consult Note (Signed)
Tonya Mcintosh, Tonya Mcintosh          ACCOUNT NO.:  [id number]   MEDICAL RECORD NO.:  [id number]          PATIENT TYPE:  INP   LOCATION:  4703                         FACILITY:  [facility]   PHYSICIAN:  Thereasa Solo. Little, M.D. DATE OF BIRTH:  05/12/1965   DATE OF CONSULTATION:  DATE OF DISCHARGE:                                   CONSULTATION   DATE OF CARDIOLOGY CONSULTATION:  November 22, 2004.   REASON FOR ADMISSION:  This 45 year old female was admitted with fever and  chills.   HISTORY OF PRESENT ILLNESS:  She has a past history of aortic valve  replacement and VSD repair  by Dr. Cathleen Fears at [facility] in 1987.  At that  time, she had aortic insufficiency, and a mechanical aortic valve was  placed.  In the distant past, she had had problems with noncompliance of her  Coumadin.   On November 01, 2004, the patient said she started having increasing  vaginal bleeding and some dysuria.  She was finally seen, November 13, 2004, at Urgent Care with flank pain and started on antibiotics; she does  not know the name.  She took only a few days of this, felt better, and  stopped the antibiotics.   In the early morning hours of November 22, 2004, she was brought to emergency  room with a temperature of 101.4 complaining of shaking chills, generalized  weakness, abdominal and flank pain.  A urinalysis was positive for multiple  WBCs.  Blood and urine cultures are pending at the time of this dictation.  Her white blood cell count was 13,100, and her hemoglobin was 9.9.  Of note,  her INR was therapeutic at 2.4.   ALLERGIES:  1.  CODEINE.  2.  VICODIN.   MEDICATIONS:  Coumadin, questionable dose, and iron tablets recently  started.   PAST MEDICAL HISTORY:  1.  Anemia.  2.  Medical noncompliance.  3.  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  4.  Status post AVR and VSD repair in 1987.   SOCIAL HISTORY:  Denies alcohol or tobacco.   FAMILY  HISTORY:  Negative for coronary disease.   REVIEW OF SYSTEMS:  No chest pain, palpitations, unilateral weakness, or  blurred vision.  She has had no PND, orthopnea, or peripheral edema.   PHYSICAL EXAMINATION:  VITAL SIGNS:  Blood pressure is 90/64, heart rate is  69, respirations are 20, and her temperature 98.1.  GENERAL:  This small-framed female is lying flat in bed with no complaints  of shortness of breath.  HEENT:  There was no evidence of any conjunctival hemorrhages, splinter  hemorrhages, rash, or Osler-Janeway nodes.  NECK:  She had no anterior cervical adenopathy.  LUNGS:  Completely clear with no wheezes or rales.  CARDIAC:  A prosthetic valve click, and a loud 3/6 systolic murmur heard  across her entire precordium.  ABDOMEN:  Soft.  She had mild suprapubic tenderness.  EXTREMITIES:  She had no peripheral edema.   ASSESSMENT:  1.  Febrile illness, suspect urinary tract infection.  Rule out  sepsis, with      blood cultures pending.  I am quite concerned about the possibility of      endocarditis, particularly in view of this loud murmur.  I have ordered      a two-D echocardiogram to rule out vegetation.  She is currently on      penicillin and gentamycin.  There is no evidence at this time of distal      embolization.  2.  Status post aortic valve replacement and VSD repair.  3.  Anemia.   I will follow the patient closely with you.           ______________________________  Thereasa Solo. Little, M.D.     ABL/MEDQ  D:  11/22/2004  T:  11/22/2004  Job:  [id number]   cc:   Madaline Savage, M.D.  Fax: [phone]

## 2010-07-10 NOTE — Discharge Summary (Signed)
[name], [name]                      ACCOUNT NO.:  [id number]   MEDICAL RECORD NO.:  [id number]                   PATIENT TYPE:  INP   LOCATION:  4706                                 FACILITY:  [facility]   PHYSICIAN:  Cristy Hilts. Jacinto Halim, M.D.                  DATE OF BIRTH:  1966/01/12   DATE OF ADMISSION:  11/24/2001  DATE OF DISCHARGE:  11/26/2001                                 DISCHARGE SUMMARY   ADMISSION DIAGNOSES:  1. Subtherapeutic Coumadin anticoagulation.  2. Chronic Coumadin therapy for aortic valve replacement.  3. Ventricular septal defect repair in 1991.  4. Medicine noncompliance.  5. History of thromboembolism.   DISCHARGE DIAGNOSES:  1. Subtherapeutic Coumadin anticoagulation; has been discharged on Lovenox     until INR therapeutic.  2. Chronic Coumadin therapy for aortic valve replacement.  3. Ventricular septal defect repair in 1991.  4. Medicine noncompliance.  5. History of thromboembolism.   HISTORY OF PRESENT ILLNESS:  Tonya Mcintosh is a 45 year old African-  American female with a history of aortic valve replacement in 1987 at [facility] at [facility] by Dr. Cathleen Fears. She had aortic insufficiency  and a VSD and had a #23 [employer] Hall prosthesis for VSD repair. She has  been on Coumadin long term; however, she has a history of medicine  noncompliance and noncompliance with physician followup.   Recent protimes have been very labile. Most recently on 11/20/01, her INR was  1.58. She was told to take 6 mg of Coumadin that day and to followup for  further instructions. She was unable to get in touch with [place], and we  attempted to call her but no answer. In any event, she was called on 11/24/01  (she had taken no Coumadin since 11/20/01). She was told to come to [facility] emergency room for admission and IV heparin. At present there are no  complaints.   PROCEDURE:  None.   CONSULTANTS:  Pharmacy, Coumadin management and case  management.   COMPLICATIONS:  None.   HOSPITAL COURSE:  Tonya Mcintosh was admitted to [facility] on  11/24/01 for heparin crossover to Coumadin. Heparin was managed by pharmacy.  Coumadin was adjusted for loading. INR on admission was 1.4. Hemoglobin  11.5, platelets 202. Potassium 4.8, BUN 7, creatinine 0.8.   The patient remained stable during her hospital stay for Coumadin loading.   On 11/26/01, laboratory studies suggested anemia with hemoglobin of 10.8. INR  was 1.6. Iron studies showed a total iron of 304, TIBC 310, percent  saturation 14 (low), ferritin 8 (low), B12 391, folate 286.   Dr. Jacinto Halim felt that the patient could be discharged home on 11/26/01 with  Lovenox cross over to Coumadin. Pharmacy dosed it at 60 mg subcu q.12h. Case  management assisted her with finding a local pharmacy to get her Lovenox  filled. Teaching instructions were reviewed with the patient and  her  husband.   The patient was discharged to home in stable condition by Dr. Jacinto Halim.   DISCHARGE MEDICATIONS:  1. Coumadin 10 mg a day.  2. Lovenox as instructed b.i.d.  3. Home health nurse is to draw an INR on Tuesday morning 11/28/01. Results     will be faxed to Dr. Truett Perna office.     Georgiann Cocker Jernejcic, P.A.                   Cristy Hilts. Jacinto Halim, M.D.    TCJ/MEDQ  D:  01/09/2002  T:  01/09/2002  Job:  [id number]

## 2010-07-10 NOTE — Discharge Summary (Signed)
NAMEQUIDA, GLASSER          ACCOUNT NO.:  [id number]   MEDICAL RECORD NO.:  [id number]          PATIENT TYPE:  INP   LOCATION:  4710                         [facility]AC[facility]  [facility]   PHYSICIAN:  Rene Paci, M.D. LHCDATE OF BIRTH:  [birth year]   DATE OF ADMISSION:  11/21/2004  DATE OF DISCHARGE:  12/01/2004                                 DISCHARGE SUMMARY   ADDENDUM:  I discussed outpatient IV antibiotic management with Dr. Orvan Falconer  from infectious disease.  His secretary will contact the patient to setup a  follow up appointment in the ID Clinic and he will follow the patient for IV  antibiotic therapy as an outpatient.      Melissa S. Peggyann Juba, NP      Rene Paci, M.D. [facility]  Electronically Signed    MSO/MEDQ  D:  12/01/2004  T:  12/01/2004  Job:  [id number]

## 2010-07-10 NOTE — H&P (Signed)
Tonya Mcintosh, Tonya Mcintosh          ACCOUNT NO.:  [id number]   MEDICAL RECORD NO.:  [id number]          PATIENT TYPE:  INP   LOCATION:  4703                         [facility]ACILITY[facility]  [facility]   PHYSICIAN:  Titus Dubin. Alwyn Ren, M.D. [facility] OF BIRTH:  [birth year]   DATE OF ADMISSION:  11/21/2004  DATE OF DISCHARGE:                                HISTORY & PHYSICAL   HISTORY OF PRESENT ILLNESS:  Tonya Mcintosh is a 45 year old African-  American female admitted with fever and rigor in the context of a recent  Streptococcal infection and a past medical history of valvular heart  disease.   She began to have some dysuria and vaginal bleeding on November 01, 2004.  She also had a fever and chills at that time.  She was seen at Urgent Care.  Apparently, pharyngeal swab revealed a Streptococcal infection for which  penicillin was prescribed.  According to her, she took two to three days  worth of the penicillin prescribed and stopped it because I got better.  Her husband states that she was told to take 10 days of antibiotics.   She was actually seen at the Urgent Care on November 13, 2004, although her  symptoms began on November 01, 2004.   She was seen by Dr. Laury Axon on November 20, 2004, and pelvic examination done  because of vaginal bleeding.  The patient states that her Coumadin was held  and she was scheduled to see a gynecologist on Monday, November 23, 2004, at  3:45.  She does not know the name of the gynecologist.  She says that the  Coumadin was held because of the anemia and vaginal bleeding.   PAST MEDICAL HISTORY:  1.  Patch operative procedure on heart valve in 1979.  She apparently had      this definitively repaired in 1989.  2.  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  Apparently, the embolic phenomenon was at the time of      pregnancy.  3.  She has had five pregnancies and two deliveries.   She has been on the Coumadin as  noted;  apparently iron was started on  November 20, 2004.   ALLERGIES:  CODEINE.   HABITS:  She does not drink or smoke.   FAMILY HISTORY:  Negative for stroke, heart attack, diabetes, or cancer.  Maternal grandmother did have heart disease.   REVIEW OF SYSTEMS:  Negative for any purulent secretions from the eyes,  ears, nose, or throat.  She denies any purulent sputum.  She has had no  diarrhea or rectal bleeding.  There are no bleeding dyscrasias.   PHYSICAL EXAMINATION:  GENERAL:  She appears somewhat lethargic, lying in  the left lateral decubitus position on the gurney.  VITAL SIGNS:  Temperature is 101.4 initially.  After Tylenol, it normalized.  Respiratory rate was 20, pulse initially was 117.  Followup pulse check was  81.  O2 saturations were 97% on room air.  Blood pressure was 104/71  initially, and subsequently was 97/59.  HEENT:  No conjunctival hemorrhages were noted.  Fundal examination was  unremarkable.  Otolaryngologic examination revealed slightly dull tympanic  membranes.  Oropharynx was clear with no exudate or erythema.  Nares were  clear.  NECK:  She had no lymphadenopathy of the head, neck, or axilla.  She was  markedly ticklish on examination of axilla.  Supple.  CHEST:  Essentially clear with no increased work of breathing.  HEART:  She had a grade 1 to 1-1/2 systolic murmur with a mechanical click  noted.  ABDOMEN:  Soft with no splenomegaly or hepatomegaly.  SKIN:  Nailbed's were normal with no telangiectasia or hemorrhage.  She has  operative scars  of  the thorax related to previous heart surgery, and an  operative scar over the cervical spine area.  EXTREMITIES:  Pulses were intact.  She had no edema.  NEUROLOGIC:  Other than the lethargy, there were no localizing  neuropsychiatric deficits.   LABORATORY DATA:  Chest x-ray was negative.   The emergency room physician has seen the patient and drawn blood cultures.  Pending is an urine culture.   She initially received ampicillin prio to  catheterization for culture.   PLAN:  She will be admitted with fever, rigor, in  context of recent,  suboptimally treated Streptococcal infection & in the context of prior  valvular surgery.  She also has had three weeks of dysuria and vaginal  bleeding.   Hematocrit will be followed closely while on a heparin protocol.  As per  Epocrates protocol, she will be placed on penicillin and gentamycin with  pharmacy to monitor.   A call has been placed as of 1:35 a.m. on November 22, 2004, to Dr. Truett Perna  coverage;  as of 1:55, no call back has been received.  The patient states  that she last saw Dr. Elsie Lincoln in December 2005.  She has been seen at [facility] on a regular basis and has no ongoing followup.   Worrisome is the patient's somewhat blase affect in regards to the  significant cardiac condition and her failure to complete the course of  antibiotics as directed.  Possibly, of a related nature is the prior embolic  phenomenon which suggests suboptimal anticoagulation with subsequent  complications.   Gynecologic consultation may be necessary;  the nursing staff will be asked  to monitor for persistent or progressive vaginal bleeding.      Titus Dubin. Alwyn Ren, M.D. [facility]  Electronically Signed     WFH/MEDQ  D:  11/22/2004  T:  11/22/2004  Job:  [id number]   cc:   Loreen Freud, M.D.  [address]  [place] [address]   Madaline Savage, M.D.  Fax: [phone]

## 2010-07-10 NOTE — Discharge Summary (Signed)
NAMETALEEN, PROSSER          ACCOUNT NO.:  [id number]   MEDICAL RECORD NO.:  [id number]          PATIENT TYPE:  INP   LOCATION:  4710                         FACILITY[facility]  [facility]   PHYSICIAN:  Rene Paci, M.D. LHCDATE OF BIRTH:  1965-03-28   DATE OF ADMISSION:  11/21/2004  DATE OF DISCHARGE:  12/01/2004                                 DISCHARGE SUMMARY   DISCHARGE DIAGNOSES:  1.  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  2.  E. coli urinary tract infection.   HISTORY OF PRESENT ILLNESS:  The patient is a 45 year old female who was  admitted with a 7-10 day history of back pain accompanied by fever and  chills.  The patient presented to Urgent Care on September 22 and was  diagnosed with strep throat and took 2 or 3 days of a penicillin  prescription prior to admission.  The patient was admitted for further  evaluation.   PAST MEDICAL HISTORY:  1.  Status post aortic valve replacement, St. Jude's 1979.  2.  Recent strep throat infection.  3.  BSD repair 1989.  4.  Complaints of vaginal bleeding.   HOSPITAL COURSE:  Problem #1.  UTI rule out sepsis.  The patient was  admitted and underwent urine culture which grew E. coli.  The patient was  treated with IV antibiotics.  A 2D echo was obtained secondary to the  patient's history of aortic valve replacement and 2D echo revealed a  questionable  vegetation as well as a panus of the prosthesis versus  thrombus.  Cardiology consult was obtained.  The patient was maintained on  anticoagulation during this hospitalization and the patient also underwent a  transesophageal echocardiogram which showed a possible perivalvular abscess  and mild leak, mild aortic insufficiency and a highly mobile vegetation.  Blood cultures were sent which were negative during this hospitalization.  The patient was seen by Dr. Cliffton Asters from infectious disease who  recommended Rocephin IV and IV Dentin for at least 6 weeks.   The patient received  a PIC line during this hospitalization.  A [facility] consult  was obtained and they felt as the patient was clinically stable and there  were no positive blood cultures for endocarditis that they wished to defer  any surgical repair at this time.  The patient and family wished to follow  up at [facility] where the patient had her previous surgeries should she  need surgical repair.  At this time, we plan to continue the patient on  Coumadin as well as Lovenox injections.  We will send the visiting nurse in  for IV infusion as well as Lovenox injections until INR is therapeutic with  a goal INR of 2-3.   MEDICATIONS AT DISCHARGE:  1.  Iron 325 mg p.o. daily.  2.  Rocephin 2 g IV daily, for at least 30 more days.  3.  Gentamycin 160 mg IV daily, for at least another 30 days.  Prescriptions      given for both IV injectables for a 30 day supply.  4.  Coumadin 3 mg p.o. daily.  5.  Lovenox 60 mg injection twice  daily.  Prescription provided for 10      injections with one refill.   FOLLOW UP:  The patient is to follow up with Dr. Loreen Freud on October 17  at 3 p.m.  In addition, the patient is to follow up in the Coumadin clinic  at [facility] on October 13.   At this time, the patient is ordered to have weekly BMETs drawn and well as  weekly Gentamycin levels.  As the patient is receiving once daily dosing as  per pharmacy, we can expect that the Gentamycin levels will be low.   LABORATORIES AT DISCHARGE:  Hemoglobin 9.4, hematocrit 28.9, white blood  cell count 6.9, platelets 357.  BUN 4, creatinine 0.9.   DISPOSITION:   DISCHARGE MEDICATIONS:   SUMMARY:      Melissa S. Peggyann Juba, NP      Rene Paci, M.D. [facility]  Electronically Signed    MSO/MEDQ  D:  12/01/2004  T:  12/01/2004  Job:  [id number]   cc:   Loreen Freud, M.D.  [address]  [place] [address]   Cristy Hilts. Jacinto Halim, MD  Fax: [phone]

## 2010-07-24 IMAGING — CR DG CHEST 1V PORT
1 series · 1 of 1 positions shown · non-contrast
Comparison: Portable exam 3333 hours compared to 12/01/2006

CLINICAL DATA: Chest pain

PORTABLE CHEST - 1 VIEW

[AP]
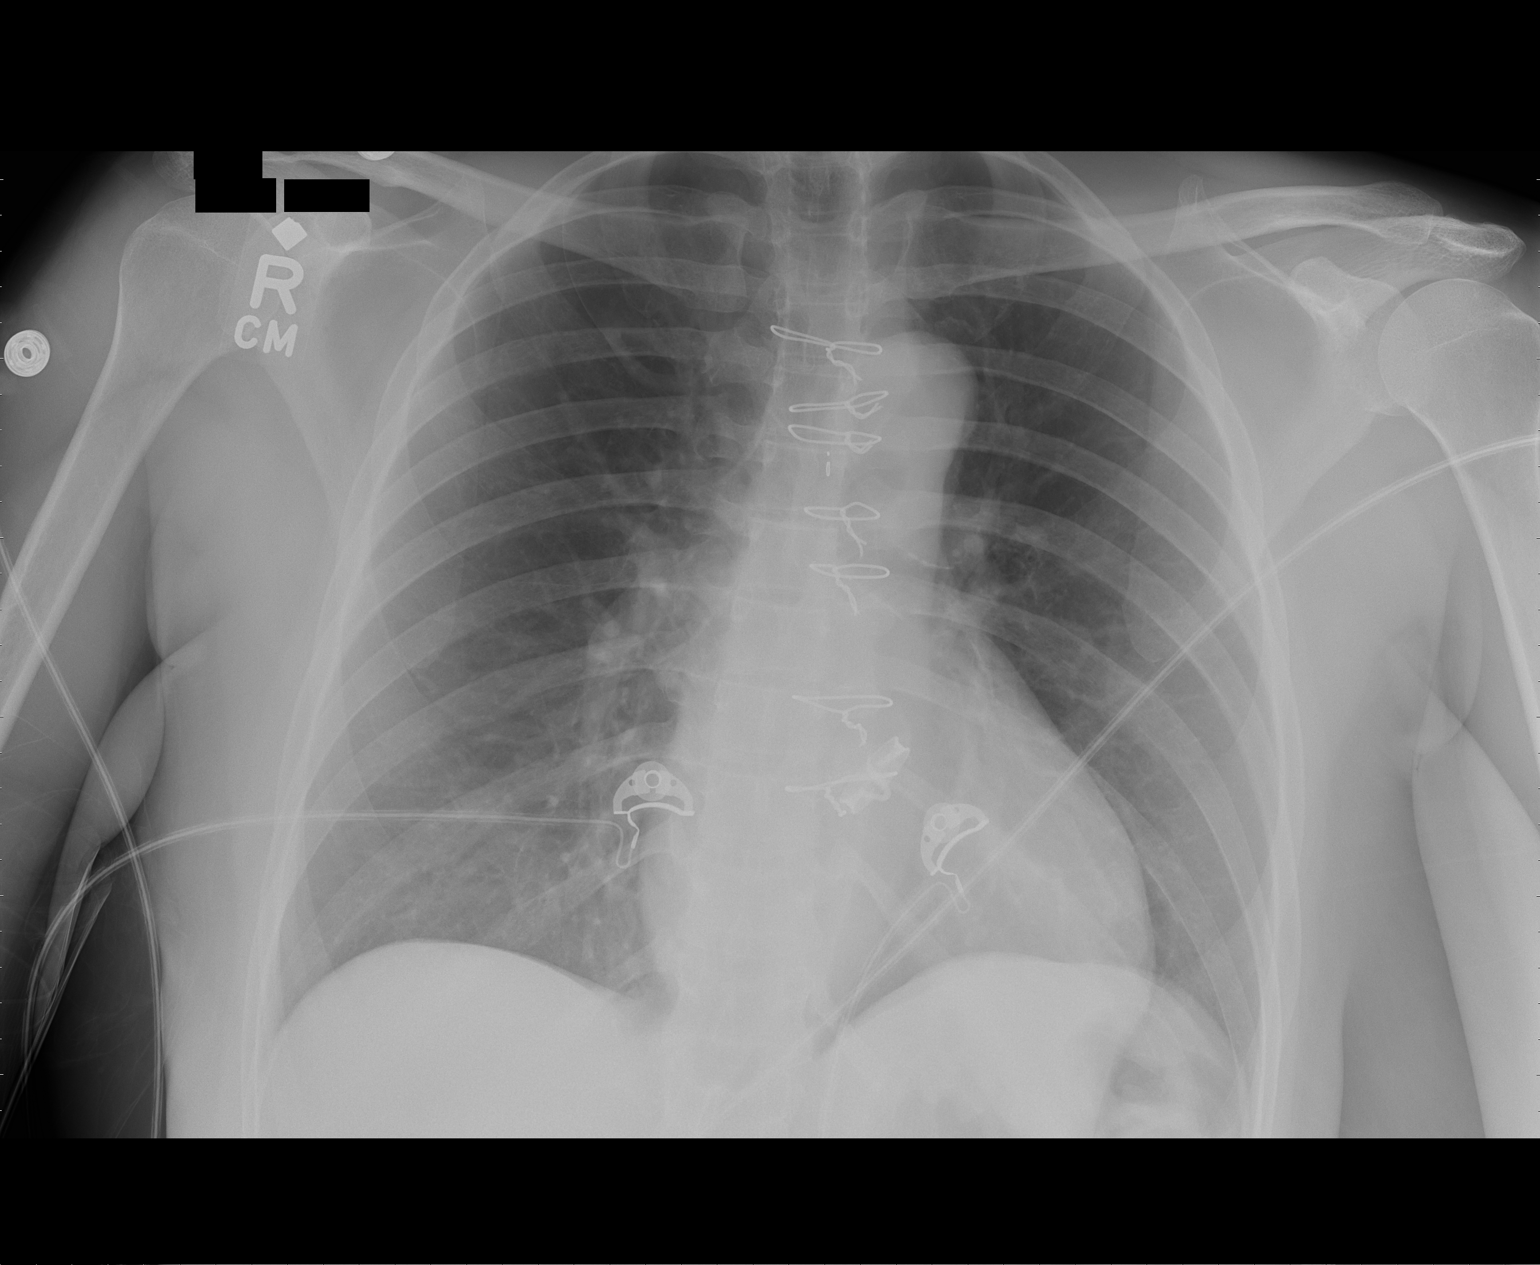

[1 of 1 positions shown; findings below may reference images not displayed]

FINDINGS: Normal heart size status post median sternotomy.
Normal mediastinal contours and pulmonary vascularity.
Lungs clear.
Azygos fissure noted.
No pleural effusion or pneumothorax.
IMPRESSION: No acute abnormalities.

## 2010-08-01 ENCOUNTER — Encounter: Payer: Self-pay | Admitting: Family Medicine

## 2010-08-03 ENCOUNTER — Ambulatory Visit (INDEPENDENT_AMBULATORY_CARE_PROVIDER_SITE_OTHER): Payer: Medicaid Other | Admitting: Family Medicine

## 2010-08-03 DIAGNOSIS — Z0000 Encounter for general adult medical examination without abnormal findings: Secondary | ICD-10-CM

## 2010-08-03 NOTE — Progress Notes (Signed)
  Subjective:    Patient ID: Tonya Mcintosh, female    DOB: 15-Jun-1965, 45 y.o.   MRN: [id number]  HPI  No show  Review of Systems     Objective:   Physical Exam        Assessment & Plan:

## 2010-11-19 LAB — URINALYSIS, ROUTINE W REFLEX MICROSCOPIC
Bilirubin Urine: NEGATIVE
Nitrite: POSITIVE — AB
Protein, ur: 30 — AB
Specific Gravity, Urine: 1.025
Urobilinogen, UA: 0.2

## 2010-11-19 LAB — URINE CULTURE

## 2010-11-19 LAB — URINE MICROSCOPIC-ADD ON

## 2010-11-27 LAB — CBC
HCT: 32.6 — ABNORMAL LOW
MCHC: 32.5
MCV: 83.6
Platelets: 340
RDW: 16.1 — ABNORMAL HIGH

## 2010-11-27 LAB — DIFFERENTIAL
Basophils Absolute: 0
Basophils Relative: 0
Eosinophils Absolute: 0 — ABNORMAL LOW
Eosinophils Relative: 0
Lymphocytes Relative: 12
Monocytes Absolute: 1

## 2010-11-27 LAB — I-STAT 8, (EC8 V) (CONVERTED LAB)
Chloride: 105
Glucose, Bld: 112 — ABNORMAL HIGH
Potassium: 3.4 — ABNORMAL LOW
pCO2, Ven: 33 — ABNORMAL LOW
pH, Ven: 7.403 — ABNORMAL HIGH

## 2010-11-27 LAB — URINE MICROSCOPIC-ADD ON

## 2010-11-27 LAB — URINALYSIS, ROUTINE W REFLEX MICROSCOPIC
Glucose, UA: NEGATIVE
Nitrite: POSITIVE — AB
Specific Gravity, Urine: 1.018
pH: 6

## 2010-11-27 LAB — INFLUENZA A+B VIRUS AG-DIRECT(RAPID): Influenza B Ag: NEGATIVE

## 2010-11-27 LAB — URINE CULTURE: Colony Count: >=100000

## 2010-12-03 LAB — DIFFERENTIAL
Basophils Absolute: 0
Basophils Relative: 1
Neutro Abs: 3.1
Neutrophils Relative %: 60

## 2010-12-03 LAB — COMPREHENSIVE METABOLIC PANEL
Alkaline Phosphatase: 59
BUN: 4 — ABNORMAL LOW
Chloride: 109
Glucose, Bld: 89
Potassium: 4.3
Total Bilirubin: 0.6

## 2010-12-03 LAB — CK TOTAL AND CKMB (NOT AT ARMC)
CK, MB: 0.6
Relative Index: 0.6
Total CK: 108

## 2010-12-03 LAB — I-STAT 8, (EC8 V) (CONVERTED LAB)
Bicarbonate: 22.5
Glucose, Bld: 86
Hemoglobin: 12.9
Sodium: 140
TCO2: 24
pH, Ven: 7.293

## 2010-12-03 LAB — CBC
HCT: 32.4 — ABNORMAL LOW
Hemoglobin: 10.4 — ABNORMAL LOW
MCHC: 32.2
RDW: 15.8 — ABNORMAL HIGH

## 2010-12-03 LAB — POCT CARDIAC MARKERS
Operator id: 146091
Troponin i, poc: <0.05

## 2010-12-03 LAB — POCT I-STAT CREATININE
Creatinine, Ser: 0.8
Operator id: 146091

## 2010-12-03 LAB — PROTIME-INR: Prothrombin Time: 30.3 — ABNORMAL HIGH

## 2010-12-03 LAB — LIPID PANEL: HDL: 58

## 2010-12-03 LAB — APTT: aPTT: 46 — ABNORMAL HIGH

## 2010-12-03 LAB — TSH: TSH: 3.163

## 2010-12-03 LAB — TROPONIN I
Troponin I: 0.01
Troponin I: 0.02
Troponin I: <0.01

## 2010-12-07 LAB — I-STAT 8, (EC8 V) (CONVERTED LAB)
BUN: 7
Chloride: 103
HCT: 42
Hemoglobin: 14.3
Operator id: 116391
Sodium: 137

## 2010-12-07 LAB — URINALYSIS, ROUTINE W REFLEX MICROSCOPIC
Nitrite: NEGATIVE
Protein, ur: NEGATIVE
Urobilinogen, UA: 0.2

## 2010-12-07 LAB — POCT URINALYSIS DIP (DEVICE)
Nitrite: NEGATIVE
Operator id: 235561
Protein, ur: NEGATIVE
Specific Gravity, Urine: 1.015
Urobilinogen, UA: 0.2

## 2010-12-07 LAB — CBC
HCT: 35.4 — ABNORMAL LOW
MCHC: 32.6
Platelets: 333
RDW: 16.3 — ABNORMAL HIGH

## 2010-12-07 LAB — PROTIME-INR
INR: 3.7 — ABNORMAL HIGH
Prothrombin Time: 38.9 — ABNORMAL HIGH

## 2010-12-07 LAB — URINE MICROSCOPIC-ADD ON

## 2010-12-07 LAB — DIFFERENTIAL
Basophils Absolute: 0
Basophils Relative: 0
Eosinophils Relative: 0
Lymphocytes Relative: 12
Neutro Abs: 7.8 — ABNORMAL HIGH

## 2010-12-07 LAB — POCT PREGNANCY, URINE: Preg Test, Ur: NEGATIVE

## 2011-02-10 ENCOUNTER — Encounter (HOSPITAL_COMMUNITY): Payer: Self-pay

## 2011-02-10 ENCOUNTER — Emergency Department (HOSPITAL_COMMUNITY)
Admission: EM | Admit: 2011-02-10 | Discharge: 2011-02-10 | Disposition: A | Discharge status: Home / Self Care | Payer: Medicaid Other | Attending: Emergency Medicine | Admitting: Emergency Medicine

## 2011-02-10 DIAGNOSIS — N12 Tubulo-interstitial nephritis, not specified as acute or chronic: Secondary | ICD-10-CM | POA: Insufficient documentation

## 2011-02-10 DIAGNOSIS — N898 Other specified noninflammatory disorders of vagina: Secondary | ICD-10-CM | POA: Insufficient documentation

## 2011-02-10 DIAGNOSIS — R109 Unspecified abdominal pain: Secondary | ICD-10-CM | POA: Insufficient documentation

## 2011-02-10 LAB — URINALYSIS, ROUTINE W REFLEX MICROSCOPIC
Nitrite: NEGATIVE
Protein, ur: NEGATIVE mg/dL
Specific Gravity, Urine: 1.005 (ref 1.005–1.030)
Urobilinogen, UA: 0.2 mg/dL (ref 0.0–1.0)

## 2011-02-10 LAB — BASIC METABOLIC PANEL
CO2: 22 mEq/L (ref 19–32)
Calcium: 9.4 mg/dL (ref 8.4–10.5)
GFR calc Af Amer: >90 mL/min (ref >90)
Sodium: 138 mEq/L (ref 135–145)

## 2011-02-10 LAB — CBC
MCV: 75.7 fL — ABNORMAL LOW (ref 78.0–100.0)
Platelets: 483 10*3/uL — ABNORMAL HIGH (ref 150–400)
RBC: 4.03 MIL/uL (ref 3.87–5.11)
RDW: 17.6 % — ABNORMAL HIGH (ref 11.5–15.5)
WBC: 10.7 10*3/uL — ABNORMAL HIGH (ref 4.0–10.5)

## 2011-02-10 LAB — PROTIME-INR
INR: 2.43 — ABNORMAL HIGH (ref 0.00–1.49)
Prothrombin Time: 26.8 seconds — ABNORMAL HIGH (ref 11.6–15.2)

## 2011-02-10 LAB — DIFFERENTIAL
Basophils Absolute: 0 10*3/uL (ref 0.0–0.1)
Eosinophils Relative: 1 % (ref 0–5)
Lymphocytes Relative: 13 % (ref 12–46)
Lymphs Abs: 1.3 10*3/uL (ref 0.7–4.0)
Neutro Abs: 8.9 10*3/uL — ABNORMAL HIGH (ref 1.7–7.7)
Neutrophils Relative %: 82 % — ABNORMAL HIGH (ref 43–77)

## 2011-02-10 LAB — URINE MICROSCOPIC-ADD ON

## 2011-02-10 LAB — POCT PREGNANCY, URINE: Preg Test, Ur: NEGATIVE

## 2011-02-10 MED ORDER — MORPHINE SULFATE 2 MG/ML IJ SOLN
2.0000 mg | Freq: Once | INTRAMUSCULAR | Status: AC
  Administered 2011-02-10: 2 mg via INTRAVENOUS
  Filled 2011-02-10: qty 1

## 2011-02-10 MED ORDER — OXYCODONE-ACETAMINOPHEN 5-325 MG PO TABS: ORAL_TABLET | ORAL | Status: DC

## 2011-02-10 MED ORDER — SULFAMETHOXAZOLE-TRIMETHOPRIM 800-160 MG PO TABS: 1.0000 | ORAL_TABLET | Freq: Two times a day (BID) | ORAL | Status: AC

## 2011-02-10 MED ORDER — MORPHINE SULFATE 4 MG/ML IJ SOLN
4.0000 mg | Freq: Once | INTRAMUSCULAR | Status: AC
  Administered 2011-02-10: 4 mg via INTRAVENOUS
  Filled 2011-02-10: qty 1

## 2011-02-10 MED ORDER — SODIUM CHLORIDE 0.9 % IV SOLN
999.0000 mL | INTRAVENOUS | Status: DC
  Administered 2011-02-10: 999 mL via INTRAVENOUS

## 2011-02-10 MED ORDER — MORPHINE SULFATE 2 MG/ML IJ SOLN: 2.0000 mg | Freq: Once | INTRAMUSCULAR | Status: DC

## 2011-02-10 MED ORDER — ONDANSETRON HCL 4 MG/2ML IJ SOLN
4.0000 mg | Freq: Once | INTRAMUSCULAR | Status: AC
  Administered 2011-02-10: 4 mg via INTRAVENOUS
  Filled 2011-02-10: qty 2

## 2011-02-10 NOTE — ED Provider Notes (Signed)
I saw and evaluated the patient, reviewed the resident's note and I agree with the findings and plan.  Nicholes Stairs, MD 02/10/11 1623

## 2011-02-10 NOTE — ED Notes (Signed)
Pt states that this morning she started to have right sided flank pain. She states that she has hx of fibroids and is unsure if this is related. Pt is having sharp stabbing right sided flank pain that bands around to the front. She took aleve pta with no relief. She denies any n/v/d.

## 2011-02-10 NOTE — ED Provider Notes (Signed)
Medical screening examination/treatment/procedure(s) were conducted as a shared visit with non-physician practitioner(s) and myself.  I personally evaluated the patient during the encounter 45 year old, female, with history of fibroids, presents to the emergency department with right-sided flank pain, and nausea.  She denies vomiting, diarrhea, hematuria, or dysuria.  She says it, frequently.  She'll get similar symptoms around her menstrual cycle because of her fibroids.  However, she finished her menses yesterday.  On examination.  She is in moderate amount of discomfort, with tenderness in the right upper quadrant, right flank.  There are no peritoneal signs, and there is no tenderness over the lower abdomen specifically in the right lower abdomen.  Urinalysis is consistent with a urinary tract infection.  We'll treat her with antibiotics and analgesics, and release her to home.  Nicholes Stairs, MD 02/10/11 1521

## 2011-02-10 NOTE — ED Notes (Signed)
Patient states that this morning she had onset of right sided flank pain with radiation around to the right lower abdominal area. No meds pta. Alert and oriented. Iv started and protocols initiated. Family at the bedside.

## 2011-02-10 NOTE — ED Provider Notes (Signed)
History     CSN: [id number] Arrival date & time: 02/10/2011  9:11 AM   First MD Initiated Contact with Patient 02/10/11 0913      Chief Complaint  Patient presents with  . Flank Pain   HPI Pt is a 45 year old female who presents with RLQ and right side pain that began at 6 am this morning.  Pt reports that she has problems with fibroids that, several times per year, cause her pain that is similar to her current pain.  Pt reports that LMP was yesterday (12/18).  Pain began without provocation, is somewhat intermittent (always some baseline pain with some additional stabbing pain) with no additional radiation.  No SOB/CP, n/v/d or urinary symptoms.  Pain is 10/10.  Pt reports that, other than some cold symptoms several days ago, she has been feeling well with no complaints prior to this morning.  Past Medical History  Diagnosis Date  . Aortic valve replaced 1987  . Abnormal vaginal Pap smear     CIN I  . Hypercholesterolemia   . VSD (ventricular septal defect) 1979    repair  . Hyperlipidemia     Past Surgical History  Procedure Date  . Aortic valve replacement 02-22-85  . Vsd repair 02-22-77    History reviewed. No pertinent family history.  History  Substance Use Topics  . Smoking status: Never Smoker   . Smokeless tobacco: Not on file  . Alcohol Use: No    OB History    Grav Para Term Preterm Abortions TAB SAB Ect Mult Living                  Review of Systems  Constitutional: Negative for fever, chills, activity change and appetite change.  HENT: Negative.   Eyes: Negative.   Respiratory: Negative.   Cardiovascular: Negative.   Gastrointestinal: Positive for abdominal pain. Negative for nausea, vomiting, diarrhea, blood in stool and abdominal distention.  Genitourinary: Positive for vaginal bleeding. Negative for dysuria, hematuria, difficulty urinating and vaginal pain.  Musculoskeletal: Negative.   Skin: Negative.   Neurological: Negative.     Allergies    Codeine  Home Medications   Current Outpatient Rx  Name Route Sig Dispense Refill  . WARFARIN SODIUM 3 MG PO TABS Oral Take 3 mg by mouth See admin instructions. Pt takes 1 3 mg tab every day except thursdays she takes 1 & 1/2 tabs      BP 98/62  Pulse 60  Temp(Src) 97.7 F (36.5 C) (Oral)  Resp 17  Ht 5\' 3"  (1.6 m)  Wt 120 lb (54.432 kg)  BMI 21.26 kg/m2  SpO2 100%  LMP 02/09/2011  Physical Exam  Constitutional: She is oriented to person, place, and time. She appears well-developed and well-nourished. She appears distressed.  HENT:  Head: Normocephalic and atraumatic.  Eyes: Conjunctivae and EOM are normal.  Neck: Normal range of motion. Neck supple.  Cardiovascular: Normal rate and regular rhythm.   Murmur heard.      III/VI holosystolic murmur  Pulmonary/Chest: Effort normal and breath sounds normal. She has no wheezes.  Abdominal: Soft. Bowel sounds are normal. She exhibits no distension and no mass. There is no tenderness. There is no rebound and no guarding.  Musculoskeletal: Normal range of motion. She exhibits no edema and no tenderness.  Neurological: She is alert and oriented to person, place, and time. No cranial nerve deficit.  Skin: Skin is warm and dry.    ED Course  Procedures (including  critical care time)  Labs Reviewed  CBC - Abnormal; Notable for the following:    WBC 10.7 (*)    Hemoglobin 9.4 (*)    HCT 30.5 (*)    MCV 75.7 (*)    MCH 23.3 (*)    RDW 17.6 (*)    Platelets 483 (*)    All other components within normal limits  DIFFERENTIAL - Abnormal; Notable for the following:    Neutrophils Relative 82 (*)    Neutro Abs 8.9 (*)    All other components within normal limits  PROTIME-INR - Abnormal; Notable for the following:    Prothrombin Time 26.8 (*)    INR 2.43 (*)    All other components within normal limits  URINALYSIS, ROUTINE W REFLEX MICROSCOPIC - Abnormal; Notable for the following:    APPearance CLOUDY (*)    Hgb urine  dipstick MODERATE (*)    Leukocytes, UA SMALL (*)    All other components within normal limits  URINE MICROSCOPIC-ADD ON - Abnormal; Notable for the following:    Squamous Epithelial / LPF FEW (*)    Bacteria, UA MANY (*)    All other components within normal limits  POCT PREGNANCY, URINE  BASIC METABOLIC PANEL  POCT PREGNANCY, URINE   No results found.   No diagnosis found.    MDM  Has evidence of pyelo.  Will give Abx and pain control.  F/u with PCP in 2 days due to possible interaction between coumadin and antibiotics.  Advised of red flags for return to ED.        Majel Homer, MD 02/10/11 [phone]

## 2011-02-10 NOTE — ED Notes (Signed)
Family at bedside. 

## 2011-02-12 LAB — URINE CULTURE
Colony Count: >=100000
Culture  Setup Time: 201212191153

## 2011-02-13 NOTE — ED Notes (Signed)
+   urine culture. Treated with Bactrim, sensitive to same per protocol MD.

## 2011-02-24 ENCOUNTER — Other Ambulatory Visit: Payer: Self-pay

## 2011-02-24 ENCOUNTER — Emergency Department (HOSPITAL_COMMUNITY): Payer: Medicaid Other

## 2011-02-24 ENCOUNTER — Emergency Department (HOSPITAL_COMMUNITY)
Admission: EM | Admit: 2011-02-24 | Discharge: 2011-02-25 | Disposition: A | Discharge status: Home / Self Care | Payer: Medicaid Other | Attending: Emergency Medicine | Admitting: Emergency Medicine

## 2011-02-24 ENCOUNTER — Encounter (HOSPITAL_COMMUNITY): Payer: Self-pay

## 2011-02-24 DIAGNOSIS — R109 Unspecified abdominal pain: Secondary | ICD-10-CM | POA: Insufficient documentation

## 2011-02-24 DIAGNOSIS — Z7901 Long term (current) use of anticoagulants: Secondary | ICD-10-CM | POA: Insufficient documentation

## 2011-02-24 DIAGNOSIS — R05 Cough: Secondary | ICD-10-CM | POA: Insufficient documentation

## 2011-02-24 DIAGNOSIS — E785 Hyperlipidemia, unspecified: Secondary | ICD-10-CM | POA: Insufficient documentation

## 2011-02-24 DIAGNOSIS — E7800 Pure hypercholesterolemia, unspecified: Secondary | ICD-10-CM | POA: Insufficient documentation

## 2011-02-24 DIAGNOSIS — R059 Cough, unspecified: Secondary | ICD-10-CM | POA: Insufficient documentation

## 2011-02-24 DIAGNOSIS — R071 Chest pain on breathing: Secondary | ICD-10-CM | POA: Insufficient documentation

## 2011-02-24 DIAGNOSIS — N200 Calculus of kidney: Secondary | ICD-10-CM | POA: Insufficient documentation

## 2011-02-24 DIAGNOSIS — Z79899 Other long term (current) drug therapy: Secondary | ICD-10-CM | POA: Insufficient documentation

## 2011-02-24 DIAGNOSIS — R079 Chest pain, unspecified: Secondary | ICD-10-CM | POA: Insufficient documentation

## 2011-02-24 LAB — DIFFERENTIAL
Basophils Absolute: 0 10*3/uL (ref 0.0–0.1)
Basophils Relative: 1 % (ref 0–1)
Eosinophils Absolute: 0.1 10*3/uL (ref 0.0–0.7)
Eosinophils Relative: 1 % (ref 0–5)
Monocytes Absolute: 0.6 10*3/uL (ref 0.1–1.0)
Monocytes Relative: 10 % (ref 3–12)
Neutro Abs: 3.7 10*3/uL (ref 1.7–7.7)

## 2011-02-24 LAB — BASIC METABOLIC PANEL
BUN: 13 mg/dL (ref 6–23)
Chloride: 104 mEq/L (ref 96–112)
Creatinine, Ser: 0.89 mg/dL (ref 0.50–1.10)
GFR calc Af Amer: >90 mL/min (ref >90)
GFR calc non Af Amer: 78 mL/min — ABNORMAL LOW (ref >90)
Potassium: 4 mEq/L (ref 3.5–5.1)

## 2011-02-24 LAB — URINALYSIS, ROUTINE W REFLEX MICROSCOPIC
Glucose, UA: NEGATIVE mg/dL
Ketones, ur: NEGATIVE mg/dL
Nitrite: NEGATIVE
Protein, ur: 100 mg/dL — AB
Urobilinogen, UA: 1 mg/dL (ref 0.0–1.0)

## 2011-02-24 LAB — CBC
HCT: 30.3 % — ABNORMAL LOW (ref 36.0–46.0)
MCHC: 31 g/dL (ref 30.0–36.0)
MCV: 76.5 fL — ABNORMAL LOW (ref 78.0–100.0)
Platelets: 362 10*3/uL (ref 150–400)
RDW: 18.5 % — ABNORMAL HIGH (ref 11.5–15.5)
WBC: 6.3 10*3/uL (ref 4.0–10.5)

## 2011-02-24 LAB — PROTIME-INR: Prothrombin Time: 32.3 seconds — ABNORMAL HIGH (ref 11.6–15.2)

## 2011-02-24 LAB — POCT I-STAT TROPONIN I

## 2011-02-24 MED ORDER — ONDANSETRON HCL 4 MG/2ML IJ SOLN
4.0000 mg | Freq: Once | INTRAMUSCULAR | Status: AC
  Administered 2011-02-24: 4 mg via INTRAVENOUS
  Filled 2011-02-24: qty 2

## 2011-02-24 MED ORDER — FENTANYL CITRATE 0.05 MG/ML IJ SOLN
100.0000 ug | Freq: Once | INTRAMUSCULAR | Status: AC
  Administered 2011-02-24: 100 ug via INTRAVENOUS
  Filled 2011-02-24: qty 2

## 2011-02-24 NOTE — ED Notes (Signed)
PT. REPORTS SUBSTERNAL CHEST PAIN ONSET THIS EVENING AND RIGHT FLANK PAIN ONSET 2 WEEKS AGO ,  NO SOB , DENIES NAUSEA OR VOMITTING , SLIGHT PRODUCTIVE COUGH , NO DIAPHORESIS.

## 2011-02-25 MED ORDER — OXYCODONE-ACETAMINOPHEN 7.5-325 MG PO TABS: 1.0000 | ORAL_TABLET | ORAL | Status: AC | PRN

## 2011-02-25 NOTE — ED Provider Notes (Signed)
History     CSN: [id number]  Arrival date & time 02/24/11  4098   First MD Initiated Contact with Patient 02/24/11 2135      Chief Complaint  Patient presents with  . Chest Pain    (Consider location/radiation/quality/duration/timing/severity/associated sxs/prior treatment) HPI Patient reports right flank pain for last 2 weeks that has gotten worse.  She was seen and recently treated for urinary tract infection and she is currently taking antibiotics as prescribed..  Patient denies fever, or vomiting.  Patient has known history of kidney stone.  Past Medical History  Diagnosis Date  . Aortic valve replaced 1987  . Abnormal vaginal Pap smear     CIN I  . Hypercholesterolemia   . VSD (ventricular septal defect) 1979    repair  . Hyperlipidemia     Past Surgical History  Procedure Date  . Aortic valve replacement 02-22-85  . Vsd repair 02-22-77    History reviewed. No pertinent family history.  History  Substance Use Topics  . Smoking status: Never Smoker   . Smokeless tobacco: Not on file  . Alcohol Use: No    OB History    Grav Para Term Preterm Abortions TAB SAB Ect Mult Living                  Review of Systems  All other systems reviewed and are negative.    Allergies  Codeine  Home Medications   Current Outpatient Rx  Name Route Sig Dispense Refill  . SULFAMETHOXAZOLE-TMP DS 800-160 MG PO TABS Oral Take 1 tablet by mouth 2 (two) times daily.      . WARFARIN SODIUM 3 MG PO TABS Oral Take 3 mg by mouth See admin instructions. Pt takes 1 3 mg tab every day except thursdays she takes 1 & 1/2 tabs    . OXYCODONE-ACETAMINOPHEN 7.5-325 MG PO TABS Oral Take 1 tablet by mouth every 4 (four) hours as needed for pain. 30 tablet 0    BP 90/60  Pulse 52  Temp(Src) 97.3 F (36.3 C) (Oral)  Resp 18  SpO2 100%  LMP 02/09/2011  Physical Exam  Nursing note and vitals reviewed. Constitutional: She is oriented to person, place, and time. She appears  well-developed and well-nourished. No distress.  HENT:  Head: Normocephalic and atraumatic.  Eyes: Pupils are equal, round, and reactive to light.  Neck: Normal range of motion.  Cardiovascular: Normal rate and intact distal pulses.   Pulmonary/Chest: No respiratory distress.  Abdominal: Normal appearance. She exhibits no distension.  Musculoskeletal: Normal range of motion.  Neurological: She is alert and oriented to person, place, and time. No cranial nerve deficit.  Skin: Skin is warm and dry. No rash noted.  Psychiatric: She has a normal mood and affect. Her behavior is normal.    ED Course  Procedures (including critical care time)  Labs Reviewed  CBC - Abnormal; Notable for the following:    Hemoglobin 9.4 (*)    HCT 30.3 (*)    MCV 76.5 (*)    MCH 23.7 (*)    RDW 18.5 (*)    All other components within normal limits  BASIC METABOLIC PANEL - Abnormal; Notable for the following:    Glucose, Bld 103 (*)    GFR calc non Af Amer 78 (*)    All other components within normal limits  URINALYSIS, ROUTINE W REFLEX MICROSCOPIC - Abnormal; Notable for the following:    Color, Urine RED (*) BIOCHEMICALS MAY BE AFFECTED BY  COLOR   APPearance CLOUDY (*)    Hgb urine dipstick LARGE (*)    Protein, ur 100 (*)    Leukocytes, UA SMALL (*)    All other components within normal limits  PROTIME-INR - Abnormal; Notable for the following:    Prothrombin Time 32.3 (*)    INR 3.08 (*)    All other components within normal limits  URINE MICROSCOPIC-ADD ON - Abnormal; Notable for the following:    Squamous Epithelial / LPF MANY (*)    All other components within normal limits  POCT I-STAT TROPONIN I  DIFFERENTIAL  I-STAT TROPONIN I   Ct Abdomen Pelvis Wo Contrast  02/24/2011  *RADIOLOGY REPORT*  Clinical Data: Abdominal and right flank pain with hematuria for 2 weeks.  Chest pain.  History of kidney stones.  CT ABDOMEN AND PELVIS WITHOUT CONTRAST  Technique:  Multidetector CT imaging of the  abdomen and pelvis was performed following the standard protocol without intravenous contrast.  Comparison: 07/08/2010 CT.  Findings: Patient is status post aortic valve replacement.  The lung bases are clear.  There is no pleural effusion.  Previously demonstrated elongated calculus in the lower pole of the right kidney has now passed into the proximal ureter and is obstructing the ureteral pelvic junction.  This measures 9 mm transverse and 23 mm cephalocaudad.  There is moderate associated hydronephrosis and mild perinephric soft tissue stranding.  Tiny calyceal calculi are present in the lower pole of the right kidney. The left kidney appears normal without calculi or hydronephrosis. No distal calculi are seen and the bladder appears normal.  The liver, gallbladder, spleen, adrenal glands and pancreas appear unremarkable.  The bowel gas pattern is normal.  Fibroid uterus appears grossly stable.  The dominant exophytic fibroid in the right fundal region has slightly enlarged, measuring 7.0 x 4.9 cm on image 71 (previously 6.2 x 4.6 cm).  No pelvic inflammatory changes are evident.  IMPRESSION:  1.  The large elongated calculus previously demonstrated in the lower pole of the right kidney is now obstructing the proximal right ureter.  This is visible on the scout image, overlapping the L3-L4 disc space. 2.  Tiny calyceal calculi in the lower pole of the right kidney. 3.  Fibroid uterus with slight enlargement of dominant right fundal fibroid.  Original Report Authenticated By: Gerrianne Scale, M.D.   Dg Chest 2 View  02/24/2011  *RADIOLOGY REPORT*  Clinical Data: Cough.  Left-sided chest pain.  Some right-sided pain as well.  CHEST - 2 VIEW  Comparison: 07/08/2010  Findings: There is been median sternotomy and previous aortic valve replacement.  Heart size is normal.  The vascularity is normal. The lungs are clear.  No infiltrate, effusion or collapse.  No significant bony finding.  IMPRESSION: No active  disease.  Previous aortic valve replacement.  Original Report Authenticated By: Thomasenia Sales, M.D.     1. Kidney stone       MDM  I spoke with Dr. Laverle Patter who agreed to see her as an outpatient this week.  Reexamination patient reveals that her pain has decreased to 1 and she is comfortable to go home.        Nelia Shi, MD 02/25/11 [phone]

## 2011-06-01 ENCOUNTER — Encounter (HOSPITAL_COMMUNITY): Payer: Self-pay | Admitting: Emergency Medicine

## 2011-06-01 ENCOUNTER — Emergency Department (HOSPITAL_COMMUNITY)
Admission: EM | Admit: 2011-06-01 | Discharge: 2011-06-02 | Discharge status: Against Medical Advice | Payer: Medicaid Other | Attending: Emergency Medicine | Admitting: Emergency Medicine

## 2011-06-01 DIAGNOSIS — J3489 Other specified disorders of nose and nasal sinuses: Secondary | ICD-10-CM | POA: Insufficient documentation

## 2011-06-01 DIAGNOSIS — R070 Pain in throat: Secondary | ICD-10-CM | POA: Insufficient documentation

## 2011-06-01 NOTE — ED Notes (Signed)
PT. REPORTS SORE THROAT/IRRITATION WITH NASAL CONGESTION FOR SEVERAL DAYS .

## 2011-06-01 NOTE — ED Notes (Signed)
Patient complaining of throat irritation for the past week; patient states that she feels like something is "stuck" every time she swallows.  Denies swallowing anything that felt like it was stuck in her throat.  Patient denies cold symptoms, nasal congestion, cough, and fever.  Patient denies pain; reports "irritation" while swallowing.  Patient alert and oriented x4; PERRL present.  Family present at bedside.  Will continue to monitor.

## 2011-06-02 NOTE — ED Notes (Signed)
Checked for patient again; patient not in room.  Patient left AMA without informing staff.

## 2011-06-02 NOTE — ED Notes (Signed)
Patient currently sitting up in bed; no respiratory or acute distress noted.  Patient updated on plan of care; informed patient that we are currently waiting on orders from PA.  Patient has no other questions or concerns; family present at bedside.  Will continue to monitor.

## 2011-06-02 NOTE — ED Notes (Signed)
PA to see patient; patient not in room.  Will look for patient in restrooms.

## 2012-02-03 ENCOUNTER — Other Ambulatory Visit (HOSPITAL_COMMUNITY): Payer: Self-pay | Admitting: Cardiology

## 2012-02-03 DIAGNOSIS — Z952 Presence of prosthetic heart valve: Secondary | ICD-10-CM

## 2012-02-14 ENCOUNTER — Inpatient Hospital Stay (HOSPITAL_COMMUNITY): Admission: RE | Admit: 2012-02-14 | Payer: Medicaid Other | Source: Ambulatory Visit

## 2012-05-11 ENCOUNTER — Ambulatory Visit: Payer: Self-pay | Admitting: Cardiology

## 2012-05-11 DIAGNOSIS — Z7901 Long term (current) use of anticoagulants: Secondary | ICD-10-CM | POA: Insufficient documentation

## 2012-07-28 ENCOUNTER — Ambulatory Visit (INDEPENDENT_AMBULATORY_CARE_PROVIDER_SITE_OTHER): Payer: Medicaid Other | Admitting: Pharmacist Clinician (PhC)/ Clinical Pharmacy Specialist

## 2012-07-28 VITALS — BP 110/72 | HR 63

## 2012-07-28 DIAGNOSIS — Z954 Presence of other heart-valve replacement: Secondary | ICD-10-CM

## 2012-07-28 DIAGNOSIS — Z7901 Long term (current) use of anticoagulants: Secondary | ICD-10-CM

## 2012-08-28 ENCOUNTER — Ambulatory Visit (INDEPENDENT_AMBULATORY_CARE_PROVIDER_SITE_OTHER): Payer: Medicaid Other | Admitting: Pharmacist Clinician (PhC)/ Clinical Pharmacy Specialist

## 2012-08-28 VITALS — BP 100/64 | HR 64

## 2012-08-28 DIAGNOSIS — Z7901 Long term (current) use of anticoagulants: Secondary | ICD-10-CM

## 2012-08-28 DIAGNOSIS — Z954 Presence of other heart-valve replacement: Secondary | ICD-10-CM

## 2012-08-28 LAB — POCT INR: INR: 3.9

## 2012-09-11 ENCOUNTER — Ambulatory Visit (INDEPENDENT_AMBULATORY_CARE_PROVIDER_SITE_OTHER): Payer: Medicaid Other | Admitting: Pharmacist Clinician (PhC)/ Clinical Pharmacy Specialist

## 2012-09-11 VITALS — BP 112/70 | HR 56

## 2012-09-11 DIAGNOSIS — Z954 Presence of other heart-valve replacement: Secondary | ICD-10-CM

## 2012-09-11 DIAGNOSIS — Z7901 Long term (current) use of anticoagulants: Secondary | ICD-10-CM

## 2012-09-11 LAB — POCT INR: INR: 2.9

## 2012-09-21 ENCOUNTER — Emergency Department (HOSPITAL_COMMUNITY): Payer: Medicaid Other

## 2012-09-21 ENCOUNTER — Emergency Department (HOSPITAL_COMMUNITY)
Admission: EM | Admit: 2012-09-21 | Discharge: 2012-09-21 | Disposition: A | Discharge status: Home / Self Care | Payer: Medicaid Other | Attending: Emergency Medicine | Admitting: Emergency Medicine

## 2012-09-21 ENCOUNTER — Encounter (HOSPITAL_COMMUNITY): Payer: Self-pay | Admitting: Emergency Medicine

## 2012-09-21 DIAGNOSIS — R0789 Other chest pain: Secondary | ICD-10-CM | POA: Insufficient documentation

## 2012-09-21 DIAGNOSIS — R112 Nausea with vomiting, unspecified: Secondary | ICD-10-CM | POA: Insufficient documentation

## 2012-09-21 DIAGNOSIS — Z87448 Personal history of other diseases of urinary system: Secondary | ICD-10-CM | POA: Insufficient documentation

## 2012-09-21 DIAGNOSIS — Z8679 Personal history of other diseases of the circulatory system: Secondary | ICD-10-CM | POA: Insufficient documentation

## 2012-09-21 DIAGNOSIS — Z862 Personal history of diseases of the blood and blood-forming organs and certain disorders involving the immune mechanism: Secondary | ICD-10-CM | POA: Insufficient documentation

## 2012-09-21 DIAGNOSIS — N12 Tubulo-interstitial nephritis, not specified as acute or chronic: Secondary | ICD-10-CM | POA: Insufficient documentation

## 2012-09-21 DIAGNOSIS — Z8639 Personal history of other endocrine, nutritional and metabolic disease: Secondary | ICD-10-CM | POA: Insufficient documentation

## 2012-09-21 DIAGNOSIS — Z87442 Personal history of urinary calculi: Secondary | ICD-10-CM | POA: Insufficient documentation

## 2012-09-21 DIAGNOSIS — Z3202 Encounter for pregnancy test, result negative: Secondary | ICD-10-CM | POA: Insufficient documentation

## 2012-09-21 DIAGNOSIS — R6883 Chills (without fever): Secondary | ICD-10-CM | POA: Insufficient documentation

## 2012-09-21 HISTORY — DX: Calculus of kidney: N20.0

## 2012-09-21 HISTORY — DX: Renal tubulo-interstitial disease, unspecified: N15.9

## 2012-09-21 LAB — URINALYSIS, ROUTINE W REFLEX MICROSCOPIC
Bilirubin Urine: NEGATIVE
Nitrite: POSITIVE — AB
Specific Gravity, Urine: 1.017 (ref 1.005–1.030)
Urobilinogen, UA: 1 mg/dL (ref 0.0–1.0)
pH: 8 (ref 5.0–8.0)

## 2012-09-21 LAB — URINE MICROSCOPIC-ADD ON

## 2012-09-21 LAB — CBC WITH DIFFERENTIAL/PLATELET
Basophils Relative: 0 % (ref 0–1)
Eosinophils Absolute: 0 10*3/uL (ref 0.0–0.7)
Hemoglobin: 10.4 g/dL — ABNORMAL LOW (ref 12.0–15.0)
Lymphs Abs: 0.6 10*3/uL — ABNORMAL LOW (ref 0.7–4.0)
MCH: 25.3 pg — ABNORMAL LOW (ref 26.0–34.0)
Monocytes Relative: 8 % (ref 3–12)
Neutro Abs: 6.4 10*3/uL (ref 1.7–7.7)
Neutrophils Relative %: 84 % — ABNORMAL HIGH (ref 43–77)
Platelets: 267 10*3/uL (ref 150–400)
RBC: 4.11 MIL/uL (ref 3.87–5.11)

## 2012-09-21 LAB — BASIC METABOLIC PANEL
Chloride: 102 mEq/L (ref 96–112)
GFR calc Af Amer: >90 mL/min (ref >90)
GFR calc non Af Amer: 87 mL/min — ABNORMAL LOW (ref >90)
Glucose, Bld: 90 mg/dL (ref 70–99)
Potassium: 3.6 mEq/L (ref 3.5–5.1)
Sodium: 135 mEq/L (ref 135–145)

## 2012-09-21 LAB — POCT I-STAT TROPONIN I: Troponin i, poc: 0.02 ng/mL (ref 0.00–0.08)

## 2012-09-21 MED ORDER — CEPHALEXIN 500 MG PO CAPS: 500.0000 mg | ORAL_CAPSULE | Freq: Four times a day (QID) | ORAL | Status: DC

## 2012-09-21 MED ORDER — ONDANSETRON 4 MG PO TBDP: 4.0000 mg | ORAL_TABLET | Freq: Three times a day (TID) | ORAL | Status: DC | PRN

## 2012-09-21 MED ORDER — SODIUM CHLORIDE 0.9 % IV BOLUS (SEPSIS)
1000.0000 mL | Freq: Once | INTRAVENOUS | Status: AC
  Administered 2012-09-21: 1000 mL via INTRAVENOUS

## 2012-09-21 MED ORDER — HYDROCODONE-ACETAMINOPHEN 5-325 MG PO TABS: 1.0000 | ORAL_TABLET | ORAL | Status: DC | PRN

## 2012-09-21 MED ORDER — ONDANSETRON HCL 4 MG/2ML IJ SOLN
4.0000 mg | Freq: Once | INTRAMUSCULAR | Status: AC
  Administered 2012-09-21: 4 mg via INTRAVENOUS
  Filled 2012-09-21: qty 2

## 2012-09-21 MED ORDER — MORPHINE SULFATE 4 MG/ML IJ SOLN
4.0000 mg | Freq: Once | INTRAMUSCULAR | Status: AC
  Administered 2012-09-21: 4 mg via INTRAVENOUS
  Filled 2012-09-21: qty 1

## 2012-09-21 MED ORDER — DEXTROSE 5 % IV SOLN
1.0000 g | Freq: Once | INTRAVENOUS | Status: AC
  Administered 2012-09-21: 1 g via INTRAVENOUS
  Filled 2012-09-21: qty 10

## 2012-09-21 NOTE — ED Notes (Signed)
Patient from home has not been feeling well since Saturday, shivering and bilateral flank pain.  Today bilateral flank pain became worse, with nausea and vomiting.

## 2012-09-21 NOTE — ED Provider Notes (Signed)
CSN: [id number]     Arrival date & time 09/21/12  1208 History     First MD Initiated Contact with Patient 09/21/12 1213     Chief Complaint  Patient presents with  . Flank Pain   (Consider location/radiation/quality/duration/timing/severity/associated sxs/prior Treatment) HPI Comments: Patient is a 47 year old female past medical history significant for h/o kidney calculi, chronic back pain, hypercholesterolemia, hyperlipidemia, Aortic valve replacement, VSD repair presenting to the ED for worsening bilateral flank pain since Saturday. Patient states her pain became worse today and this will associated nausea and nonbilious non-bloody vomiting and subjective chills. Patient rates her pain with no alleviating factors. She describes her pain as sharp waxing and waning in nature. Patient states her pain feels like previous kidney stones. She denies any fevers, dysuria, hematuria, urgency, frequency, abdominal pain.    Past Medical History  Diagnosis Date  . Aortic valve replaced 1987  . Abnormal vaginal Pap smear     CIN I  . Hypercholesterolemia   . VSD (ventricular septal defect) 1979    repair  . Hyperlipidemia   . Kidney calculi   . Kidney infection    Past Surgical History  Procedure Laterality Date  . Aortic valve replacement  02-22-85  . Vsd repair  02-22-77   No family history on file. History  Substance Use Topics  . Smoking status: Never Smoker   . Smokeless tobacco: Not on file  . Alcohol Use: No   OB History   Grav Para Term Preterm Abortions TAB SAB Ect Mult Living                 Review of Systems  Constitutional: Negative for fever.       Subjective chills  HENT: Negative.   Eyes: Negative.   Respiratory: Negative for shortness of breath.   Cardiovascular: Negative for chest pain.  Gastrointestinal: Positive for nausea and vomiting. Negative for diarrhea.  Genitourinary: Positive for flank pain. Negative for urgency and hematuria.  Musculoskeletal:  Negative.   Skin: Negative.   Neurological: Negative.     Allergies  Codeine  Home Medications   Current Outpatient Rx  Name  Route  Sig  Dispense  Refill  . ibuprofen (ADVIL,MOTRIN) 200 MG tablet   Oral   Take 200 mg by mouth every 6 (six) hours as needed for pain.         Marland Kitchen warfarin (COUMADIN) 3 MG tablet   Oral   Take 3-4.5 mg by mouth See admin instructions. Take 4.5mg  on Mon & Friday & 3mg  on all other days         . cephALEXin (KEFLEX) 500 MG capsule   Oral   Take 1 capsule (500 mg total) by mouth 4 (four) times daily.   40 capsule   0   . ondansetron (ZOFRAN ODT) 4 MG disintegrating tablet   Oral   Take 1 tablet (4 mg total) by mouth every 8 (eight) hours as needed for nausea.   10 tablet   0    BP 103/68  Pulse 65  Temp(Src) 98.3 F (36.8 C) (Oral)  Resp 16  SpO2 100%  LMP 09/05/2012 Physical Exam  Constitutional: She is oriented to person, place, and time. She appears well-developed and well-nourished. No distress.  HENT:  Head: Normocephalic and atraumatic.  Eyes: Conjunctivae are normal.  Neck: Neck supple.  Cardiovascular: Normal rate, regular rhythm, normal heart sounds and intact distal pulses.   Pulmonary/Chest: Effort normal and breath sounds normal. No respiratory distress.  Abdominal: Soft. Bowel sounds are normal. There is no tenderness. There is no rigidity, no rebound and no guarding.  Neurological: She is alert and oriented to person, place, and time.  Skin: Skin is warm and dry. She is not diaphoretic.    ED Course   Procedures (including critical care time)   Date: 09/21/2012  Rate: 68  Rhythm: normal sinus rhythm  QRS Axis: normal  Intervals: normal  ST/T Wave abnormalities: normal  Conduction Disutrbances:none  Narrative Interpretation:   Old EKG Reviewed: none available    Labs Reviewed  URINALYSIS, ROUTINE W REFLEX MICROSCOPIC - Abnormal; Notable for the following:    APPearance CLOUDY (*)    Hgb urine dipstick  LARGE (*)    Nitrite POSITIVE (*)    Leukocytes, UA MODERATE (*)    All other components within normal limits  CBC WITH DIFFERENTIAL - Abnormal; Notable for the following:    Hemoglobin 10.4 (*)    HCT 33.0 (*)    MCH 25.3 (*)    RDW 15.7 (*)    Neutrophils Relative % 84 (*)    Lymphocytes Relative 8 (*)    Lymphs Abs 0.6 (*)    All other components within normal limits  BASIC METABOLIC PANEL - Abnormal; Notable for the following:    BUN 4 (*)    GFR calc non Af Amer 87 (*)    All other components within normal limits  URINE MICROSCOPIC-ADD ON - Abnormal; Notable for the following:    Squamous Epithelial / LPF FEW (*)    Bacteria, UA MANY (*)    All other components within normal limits  URINE CULTURE  PREGNANCY, URINE  POCT I-STAT TROPONIN I   Ct Abdomen Pelvis Wo Contrast  09/21/2012   *RADIOLOGY REPORT*  Clinical Data: Flank and back pain, hematuria  CT ABDOMEN AND PELVIS WITHOUT CONTRAST  Technique:  Multidetector CT imaging of the abdomen and pelvis was performed following the standard protocol without intravenous contrast.  Comparison: 02/24/2011  Findings: Streaky bibasilar atelectasis.  No lower lobe pneumonia. Negative for pericardial or pleural effusion.  Normal heart size. No hiatal hernia.  Abdomen:  Kidneys demonstrate no acute obstructive uropathy, hydronephrosis, or obstructing ureteral calculus on either side. Stable punctate right hemi pelvis calcification compatible with a venous phlebolith, image 68.  Liver, gallbladder, biliary system, pancreas, spleen, and adrenal glands are within normal limits for a noncontrast exam.  No abdominal free fluid, fluid collection, hemorrhage, adenopathy, or abscess  Negative for bowel obstruction, dilatation, ileus, or free air.  Pelvis:  Appendix contains contrast and appears unremarkable.  No definite distal pelvic dilated ureter, ureteral calculus, or UVJ abnormality.  Urinary bladder is collapsed.  Enlarged fibroid uterus noted with a  dominant anterior pedunculated fibroid measuring 6.8 x 5.1 cm impressing upon the bladder, image 65. Fibroid uterus is grossly stable in size. Trace pelvic free fluid, likely physiologic.  No pelvic fluid collection, hemorrhage, adenopathy, abscess, inguinal abnormality, or hernia.  No definite acute distal bowel process.  No osseous abnormality.  IMPRESSION: No acute obstructing urinary tract calculus, obstructive uropathy, or hydronephrosis.  No acute intra-abdominal or pelvic process  Normal appendix demonstrated  Enlarged fibroid uterus, stable   Original Report Authenticated By: Judie Petit. Miles Costain, M.D.   Dg Chest 2 View  09/21/2012   *RADIOLOGY REPORT*  Clinical Data: Chest pain  CHEST - 2 VIEW  Comparison:  February 24, 2011  Views: PA and lateral views.  FINDINGS: There is no focal infiltrate, pulmonary edema, or  pleural effusion. The mediastinal contour and cardiac silhouette are stable.  Cardiac valvular replacement ring is unchanged. The soft tissue and osseous structures are stable.  IMPRESSION: No acute cardiopulmonary disease identified.   Original Report Authenticated By: Sherian Rein, M.D.   1. Pyelonephritis   2. Chest pain, atypical     MDM  Patient presenting with bilateral flank pain w/ associated nausea and vomiting. During course in ED patient mentioned that she has been having on and off CP for the last few weeks as well. Abdomen S/NT/ND with bowel sounds present. Mild bilateral CVA tenderness. Labs, imaging, EKG, UA reviewed. No concern for acute cardiac cause of intermittent CP. CT unremarkable. Pt will be give 1g Rocephin in ED for pyelo and sent home with 10 days of Keflex, unable to take Cipro d/t interaction with Coumadin. Pain and symptoms managed in ED. Advised PCP and cardiology f/u in 1-2 days. Return precautions discussed. Patient is agreeable to plan. Patient d/w with Dr. Silverio Lay, agrees with plan. Patient is stable at time of discharge         Jeannetta Ellis,  PA-C 09/21/12 1640

## 2012-09-21 NOTE — ED Notes (Signed)
Bed:WA23<BR> Expected date:<BR> Expected time:<BR> Means of arrival:<BR> Comments:<BR> EMS

## 2012-09-24 LAB — URINE CULTURE: Colony Count: >=100000

## 2012-09-25 NOTE — ED Provider Notes (Signed)
Medical screening examination/treatment/procedure(s) were performed by non-physician practitioner and as supervising physician I was immediately available for consultation/collaboration.   Leory Allinson H Jahziah Simonin, MD 09/25/12 1105 

## 2012-10-09 ENCOUNTER — Ambulatory Visit (INDEPENDENT_AMBULATORY_CARE_PROVIDER_SITE_OTHER): Payer: Medicaid Other | Admitting: Pharmacist Clinician (PhC)/ Clinical Pharmacy Specialist

## 2012-10-09 VITALS — BP 100/66 | HR 68

## 2012-10-09 DIAGNOSIS — Z954 Presence of other heart-valve replacement: Secondary | ICD-10-CM

## 2012-10-09 DIAGNOSIS — Z7901 Long term (current) use of anticoagulants: Secondary | ICD-10-CM

## 2012-10-15 ENCOUNTER — Emergency Department (HOSPITAL_COMMUNITY): Payer: Medicaid Other

## 2012-10-15 ENCOUNTER — Encounter (HOSPITAL_COMMUNITY): Payer: Self-pay | Admitting: *Deleted

## 2012-10-15 ENCOUNTER — Emergency Department (HOSPITAL_COMMUNITY)
Admission: EM | Admit: 2012-10-15 | Discharge: 2012-10-15 | Disposition: A | Discharge status: Home / Self Care | Payer: Medicaid Other | Attending: Emergency Medicine | Admitting: Emergency Medicine

## 2012-10-15 DIAGNOSIS — Z87442 Personal history of urinary calculi: Secondary | ICD-10-CM | POA: Insufficient documentation

## 2012-10-15 DIAGNOSIS — X58XXXA Exposure to other specified factors, initial encounter: Secondary | ICD-10-CM | POA: Insufficient documentation

## 2012-10-15 DIAGNOSIS — Z87448 Personal history of other diseases of urinary system: Secondary | ICD-10-CM | POA: Insufficient documentation

## 2012-10-15 DIAGNOSIS — R011 Cardiac murmur, unspecified: Secondary | ICD-10-CM | POA: Insufficient documentation

## 2012-10-15 DIAGNOSIS — Z862 Personal history of diseases of the blood and blood-forming organs and certain disorders involving the immune mechanism: Secondary | ICD-10-CM | POA: Insufficient documentation

## 2012-10-15 DIAGNOSIS — M940 Chondrocostal junction syndrome [Tietze]: Secondary | ICD-10-CM | POA: Insufficient documentation

## 2012-10-15 DIAGNOSIS — Z8639 Personal history of other endocrine, nutritional and metabolic disease: Secondary | ICD-10-CM | POA: Insufficient documentation

## 2012-10-15 DIAGNOSIS — Y9389 Activity, other specified: Secondary | ICD-10-CM | POA: Insufficient documentation

## 2012-10-15 DIAGNOSIS — Z8679 Personal history of other diseases of the circulatory system: Secondary | ICD-10-CM | POA: Insufficient documentation

## 2012-10-15 DIAGNOSIS — Y929 Unspecified place or not applicable: Secondary | ICD-10-CM | POA: Insufficient documentation

## 2012-10-15 MED ORDER — IBUPROFEN 800 MG PO TABS
800.0000 mg | ORAL_TABLET | Freq: Once | ORAL | Status: AC
  Administered 2012-10-15: 800 mg via ORAL
  Filled 2012-10-15: qty 1

## 2012-10-15 MED ORDER — ACETAMINOPHEN 500 MG PO TABS: 500.0000 mg | ORAL_TABLET | Freq: Four times a day (QID) | ORAL | Status: AC | PRN

## 2012-10-15 NOTE — ED Notes (Signed)
MD at bedside. 

## 2012-10-15 NOTE — ED Notes (Signed)
PT IS ON COUMADIN 

## 2012-10-15 NOTE — ED Notes (Signed)
Report received, assumed care.  

## 2012-10-15 NOTE — ED Notes (Signed)
pT STATES SHE LEANED UP AGAINST SOMETHING YESTERDAY ON LEFT RIB SIDE AND THINKS SHE BROKE HER RIBS.  PAIN WITH DEEP BREATH

## 2012-10-15 NOTE — ED Provider Notes (Signed)
CSN: [id number]     Arrival date & time 10/15/12  1132 History     First MD Initiated Contact with Patient 10/15/12 1358     Chief Complaint  Patient presents with  . RIB PAIN    (Consider location/radiation/quality/duration/timing/severity/associated sxs/prior Treatment) HPI  47 year old female presents complaining of rib pain.  Pt sts yesterday afternoon while helping a friend fix his truck pt accidentally leaned against the hard edge of the truck.  She felt a sharp pain to her L anterior lower chest.  Report rib pain has been presence since, worseing with deep breath or with palpation.  Pt thinks she may have broken a rib.  Denies fever, productive cough, hemoptysis, SOB, or abnormal bleeding.  No specific treatment tried.  Denies any risk factors for PE including no recent surgery, prolonged bed rest, hx of CA, or birth control use.    Past Medical History  Diagnosis Date  . Aortic valve replaced 1987  . Abnormal vaginal Pap smear     CIN I  . Hypercholesterolemia   . VSD (ventricular septal defect) 1979    repair  . Hyperlipidemia   . Kidney calculi   . Kidney infection    Past Surgical History  Procedure Laterality Date  . Aortic valve replacement  02-22-85  . Vsd repair  02-22-77   No family history on file. History  Substance Use Topics  . Smoking status: Never Smoker   . Smokeless tobacco: Not on file  . Alcohol Use: No   OB History   Grav Para Term Preterm Abortions TAB SAB Ect Mult Living                 Review of Systems  Constitutional: Negative for fever.  Respiratory: Negative for shortness of breath.   Cardiovascular: Positive for chest pain.  Neurological: Negative for numbness.    Allergies  Codeine  Home Medications   Current Outpatient Rx  Name  Route  Sig  Dispense  Refill  . ibuprofen (ADVIL,MOTRIN) 200 MG tablet   Oral   Take 600 mg by mouth every 6 (six) hours as needed for pain.          Marland Kitchen warfarin (COUMADIN) 3 MG tablet   Oral  Take 3-4.5 mg by mouth See admin instructions. Take 4.5mg  on Mon & Friday & 3mg  on all other days         . ondansetron (ZOFRAN ODT) 4 MG disintegrating tablet   Oral   Take 1 tablet (4 mg total) by mouth every 8 (eight) hours as needed for nausea.   10 tablet   0    BP 123/79  Pulse 60  Temp(Src) 98 F (36.7 C) (Oral)  Resp 16  SpO2 100%  LMP 09/05/2012 Physical Exam  Nursing note and vitals reviewed. Constitutional: She is oriented to person, place, and time. She appears well-developed and well-nourished. No distress.  HENT:  Head: Atraumatic.  Eyes: Conjunctivae are normal.  Neck: Neck supple.  Cardiovascular: Normal rate and regular rhythm.   Murmur (4/6 systolic murmur) heard. Pulmonary/Chest: Effort normal and breath sounds normal. No respiratory distress. She has no wheezes. She has no rales. She exhibits tenderness (point tenderness to left anterior inferior chest wall, no emphysema, ecchymosis, crepitus, or paradoxical movement.  ).  Abdominal: Soft. There is no tenderness.  No tenderness to splenic region  Neurological: She is alert and oriented to person, place, and time.  Skin: Skin is warm. No rash noted.  ED Course   Procedures (including critical care time)  2:15 PM Pt with L rib injury, xray without evidence of acute rib fx.  Abdomen soft and nontender.  Recommend RICE therapy for chest wall pain.   Labs Reviewed - No data to display Dg Ribs Unilateral W/chest Left  10/15/2012   *RADIOLOGY REPORT*  Clinical Data:  Left anterior rib pain, leaned hard against something a few days ago, pain under left breast, history AVR, VSD repair  LEFT RIBS AND CHEST - 3+ VIEW  Comparison: Chest radiograph 09/21/2012  Findings: Upper normal heart size post AVR. Mediastinal contours and pulmonary vascularity normal. Azygos fissure noted. Lungs clear. No pleural effusion or pneumothorax. Osseous mineralization normal. No rib fracture of bone destruction.  IMPRESSION: No  acute abnormalities.   Original Report Authenticated By: Ulyses Southward, M.D.   1. Costochondritis, acute     MDM  BP 123/79  Pulse 60  Temp(Src) 98 F (36.7 C) (Oral)  Resp 16  SpO2 100%  LMP 09/05/2012   Fayrene Helper, PA-C 10/15/12 1456  Fayrene Helper, PA-C 10/15/12 1456

## 2012-10-18 NOTE — ED Provider Notes (Signed)
Medical screening examination/treatment/procedure(s) were performed by non-physician practitioner and as supervising physician I was immediately available for consultation/collaboration.   Candyce Churn, MD 10/18/12 [phone]

## 2012-10-27 ENCOUNTER — Ambulatory Visit: Payer: Medicaid Other

## 2012-11-08 ENCOUNTER — Ambulatory Visit (INDEPENDENT_AMBULATORY_CARE_PROVIDER_SITE_OTHER): Payer: Medicaid Other | Admitting: Pharmacist Clinician (PhC)/ Clinical Pharmacy Specialist

## 2012-11-08 VITALS — BP 120/80 | HR 80

## 2012-11-08 DIAGNOSIS — Z954 Presence of other heart-valve replacement: Secondary | ICD-10-CM

## 2012-11-08 DIAGNOSIS — Z7901 Long term (current) use of anticoagulants: Secondary | ICD-10-CM

## 2012-11-08 LAB — POCT INR: INR: 3.2

## 2012-11-14 ENCOUNTER — Other Ambulatory Visit (HOSPITAL_COMMUNITY): Payer: Self-pay | Admitting: Cardiology

## 2012-11-21 ENCOUNTER — Other Ambulatory Visit (HOSPITAL_COMMUNITY): Payer: Self-pay | Admitting: Cardiology

## 2012-12-06 ENCOUNTER — Ambulatory Visit: Payer: Medicaid Other | Admitting: Pharmacist Clinician (PhC)/ Clinical Pharmacy Specialist

## 2012-12-14 ENCOUNTER — Other Ambulatory Visit: Payer: Self-pay | Admitting: Emergency Medicine

## 2012-12-14 DIAGNOSIS — Z1231 Encounter for screening mammogram for malignant neoplasm of breast: Secondary | ICD-10-CM

## 2012-12-15 ENCOUNTER — Ambulatory Visit (INDEPENDENT_AMBULATORY_CARE_PROVIDER_SITE_OTHER): Payer: Medicaid Other | Admitting: Pharmacist Clinician (PhC)/ Clinical Pharmacy Specialist

## 2012-12-15 ENCOUNTER — Encounter: Payer: Self-pay | Admitting: Cardiovascular Disease

## 2012-12-15 ENCOUNTER — Ambulatory Visit: Payer: Medicaid Other

## 2012-12-15 ENCOUNTER — Ambulatory Visit (INDEPENDENT_AMBULATORY_CARE_PROVIDER_SITE_OTHER): Payer: Medicaid Other | Admitting: Cardiovascular Disease

## 2012-12-15 VITALS — BP 102/72 | HR 56 | Ht 62.0 in | Wt 126.9 lb

## 2012-12-15 DIAGNOSIS — Z7901 Long term (current) use of anticoagulants: Secondary | ICD-10-CM

## 2012-12-15 DIAGNOSIS — Z954 Presence of other heart-valve replacement: Secondary | ICD-10-CM

## 2012-12-15 DIAGNOSIS — Z952 Presence of prosthetic heart valve: Secondary | ICD-10-CM

## 2012-12-15 LAB — POCT INR: INR: 3.4

## 2012-12-15 NOTE — Progress Notes (Signed)
12/15/2012 Buren Kos   1965-08-07  [id number]  Primary Physician No PCP Per Patient Primary Cardiologist: Runell Gess MD Roseanne Reno   HPI:  Ms. Laural Benes is a 47 year old thin-appearing divorced African American female mother of 2, grandmother to 3 grandchildren. She is a patient of Dr. Onalee Hua Hardings. 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 She has had 2 aortic valve replacement procedures, most recently in 2010 where she had a St. Jude bileaflet mechanical prosthesis placed. Cardiac catheterization done prior to this revealed normal coronary arteries and normal LV function. She is on chronic Coumadin and her blood work is followed in our office. She is aware of bacterial endocarditis precautions. She saw her primary care physicians at [facility] urgent care who referred her here for further evaluation. She was seen 02/02/12 by Dr. Herbie Baltimore who plans on seeing her back in one year.   Current Outpatient Prescriptions  Medication Sig Dispense Refill  . acetaminophen (TYLENOL) 500 MG tablet Take 1 tablet (500 mg total) by mouth every 6 (six) hours as needed for pain.  30 tablet  0  . ibuprofen (ADVIL,MOTRIN) 200 MG tablet Take 600 mg by mouth every 6 (six) hours as needed for pain.       Marland Kitchen warfarin (COUMADIN) 3 MG tablet TAKE 1- 1 AND 1/2 TABLETS BY MOUTH EVERY DAY OR AS DIRECTED  135 tablet  1   No current facility-administered medications for this visit.    Allergies  Allergen Reactions  . Codeine Itching    History   Social History  . Marital Status: Legally Separated    Spouse Name: N/A    Number of Children: N/A  . Years of Education: N/A   Occupational History  . Not on file.   Social History Main Topics  . Smoking status: Never Smoker   . Smokeless tobacco: Not on file  . Alcohol Use: No  . Drug Use: No  . Sexual Activity: Not on file   Other Topics Concern  . Not on file    Social History Narrative   Lives with ex-husband and children in [place]. No smoking, no etoh, disability     Review of Systems: General: negative for chills, fever, night sweats or weight changes.  Cardiovascular: negative for chest pain, dyspnea on exertion, edema, orthopnea, palpitations, paroxysmal nocturnal dyspnea or shortness of breath Dermatological: negative for rash Respiratory: negative for cough or wheezing Urologic: negative for hematuria Abdominal: negative for nausea, vomiting, diarrhea, bright red blood per rectum, melena, or hematemesis Neurologic: negative for visual changes, syncope, or dizziness All other systems reviewed and are otherwise negative except as noted above.    Blood pressure 102/72, pulse 56, height 5\' 2"  (1.575 m), weight 126 lb 14.4 oz (57.561 kg).  General appearance: alert and no distress Neck: no adenopathy, no carotid bruit, no JVD, supple, symmetrical, trachea midline and thyroid not enlarged, symmetric, no tenderness/mass/nodules Lungs: clear to auscultation bilaterally Heart: she has typical prosthetic aortic valve sounds with a soft outflow tract murmur Extremities: extremities normal, atraumatic, no cyanosis or edema  EKG sinus bradycardia at 56 without ST or T wave changes  ASSESSMENT AND PLAN:   AORTIC VALVE REPLACEMENT, HX OF Remote history of congenital heart disease with ventricular septal defect and aortic regurgitation treated in 1980. She had aortic valve replacement with MH Medtronic prosthesis in 1987, and her cardiac Dynacin 2006 and redo with a St. Jude aortic valve replacement  in 2010. Cath showed normal coronary arteries. She has normal LV function. She is aware of bacterial endocarditis precautions. She is on chronic Coumadin anticoagulation.      Runell Gess MD FACP,FACC,FAHA, [facility] 12/15/2012 10:12 AM

## 2012-12-15 NOTE — Patient Instructions (Signed)
Your physician wants you to follow-up in: 1 year with Dr Harding.  You will receive a reminder letter in the mail two months in advance. If you don't receive a letter, please call our office to schedule the follow-up appointment.  

## 2012-12-15 NOTE — Assessment & Plan Note (Signed)
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 She had aortic valve replacement with MH Medtronic prosthesis in 1987, and her cardiac Dynacin 2006 and redo with a St. Jude aortic valve replacement in 2010. Cath showed normal coronary arteries. She has normal LV function. She is aware of bacterial endocarditis precautions. She is on chronic Coumadin anticoagulation.

## 2013-01-05 ENCOUNTER — Ambulatory Visit: Payer: Medicaid Other

## 2013-01-17 ENCOUNTER — Ambulatory Visit: Payer: Medicaid Other | Admitting: Pharmacist Clinician (PhC)/ Clinical Pharmacy Specialist

## 2013-01-23 ENCOUNTER — Ambulatory Visit (INDEPENDENT_AMBULATORY_CARE_PROVIDER_SITE_OTHER): Payer: Medicaid Other | Admitting: Pharmacist Clinician (PhC)/ Clinical Pharmacy Specialist

## 2013-01-23 VITALS — BP 110/74 | HR 72

## 2013-01-23 DIAGNOSIS — Z7901 Long term (current) use of anticoagulants: Secondary | ICD-10-CM

## 2013-01-23 DIAGNOSIS — Z954 Presence of other heart-valve replacement: Secondary | ICD-10-CM

## 2013-02-01 ENCOUNTER — Ambulatory Visit (INDEPENDENT_AMBULATORY_CARE_PROVIDER_SITE_OTHER): Payer: Medicaid Other | Admitting: Cardiology

## 2013-02-01 ENCOUNTER — Encounter: Payer: Self-pay | Admitting: Cardiology

## 2013-02-01 VITALS — BP 122/82 | HR 52 | Ht 62.0 in | Wt 122.9 lb

## 2013-02-01 DIAGNOSIS — Z952 Presence of prosthetic heart valve: Secondary | ICD-10-CM

## 2013-02-01 DIAGNOSIS — R0789 Other chest pain: Secondary | ICD-10-CM

## 2013-02-01 DIAGNOSIS — E785 Hyperlipidemia, unspecified: Secondary | ICD-10-CM

## 2013-02-01 DIAGNOSIS — R071 Chest pain on breathing: Secondary | ICD-10-CM

## 2013-02-01 DIAGNOSIS — Z7901 Long term (current) use of anticoagulants: Secondary | ICD-10-CM

## 2013-02-01 DIAGNOSIS — Z954 Presence of other heart-valve replacement: Secondary | ICD-10-CM

## 2013-02-01 NOTE — Progress Notes (Signed)
PATIENT: Tonya Mcintosh MRN: [id number]  DOB: [birth year]   DOV:02/03/2013 PCP: No PCP Per Patient  Clinic Note: Chief Complaint  Patient presents with  . Follow-up     last visit 04/2012 with Dr Herbie Baltimore. chest pain today- left breast to back sharp , no sob, no edema   HPI: Tonya Mcintosh is a 47 y.o.  female with a PMH below who presents today for cardiology followup but with a complaint of chest pain. She is a very pleasant woman who has a history of congenital heart disease VSD and aortic regurgitation initially treated in 1980 with VSD closure and aortic valvuloplasty. She then had redo operation in 1987 with a Medtronic mechanical prosthesis. She then had a bout of endocarditis in 2006, eventually leading to redo valve surgery in 2010 with placement of a bileaflet St. Jude mechanical valve. She was supposed to have an echocardiogram done after her last visit with me in December of 2013, but did not come for the echo. She is actually seeing a Dr. Allyson Sabal in this office about 2 months ago, she was doing relatively well without complaints. No adjustments were made.  Interval History: She now presents with a complaint of chest discomfort tenderness along the left costal margin were accompanied her left breast. It think an on for about a week now coming and going. It is a sharp discomfort not pleuritic in nature not made worse in a sharp sense by inspiration, but it is made worse with coughing. It is not positional sores worse with lying down or better with sitting up. She denies any dyspnea or PND/orthopnea. Discomfort she has no chest is not exertional. It hurts with coughing.  The remainder of Cardiovascular ROS: positive for - chest pain, irregular heartbeat and Not a worrisome irregular heartbeat negative for - dyspnea on exertion, edema, loss of consciousness, orthopnea, palpitations, paroxysmal nocturnal dyspnea, rapid heart rate or shortness of breath: Additional cardiac review of  systems: Lightheadedness - no, dizziness - no, syncope/near-syncope - no; TIA/amaurosis fugax - no Melena - no, hematochezia no; hematuria - no; nosebleeds - no; claudication - no  Past Medical History  Diagnosis Date  . Aortic valve replaced 1987; September 2010  . Abnormal vaginal Pap smear     CIN I  . Hypercholesterolemia   . VSD (ventricular septal defect) 1979    repair  . Hyperlipidemia   . Kidney calculi   . Kidney infection   . 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  . H/O insomnia     Prior Cardiac Evaluation and Past Surgical History: Past Surgical History  Procedure Laterality Date  . Aortic valve replacement  07/17/1985    23-mm Medtronic Hall prosthesis  . Vsd repair  02-22-77  . Cardiac catheterization  01/25/1985    Preop 10 2010, no significant CAD.  Marland Kitchen Cardiovascular stress test  09/22/2008    Fixed defect at the apex. No stress induced ischemia.  . 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 Jude tactile prosthesis (bileaflet)  . Transthoracic echocardiogram  12/25/2008    EF >55%, doppler evidence of regurg is probably normal of St Jude prosthetic aortic valve.    Allergies  Allergen Reactions  . Codeine Itching    Current Outpatient Prescriptions  Medication Sig Dispense Refill  . acetaminophen (TYLENOL) 500 MG tablet Take 1 tablet (500 mg total) by mouth every 6 (six) hours as  needed for pain.  30 tablet  0  . ibuprofen (ADVIL,MOTRIN) 200 MG tablet Take 600 mg by mouth every 6 (six) hours as needed for pain.       Marland Kitchen warfarin (COUMADIN) 3 MG tablet TAKE 1- 1 AND 1/2 TABLETS BY MOUTH EVERY DAY OR AS DIRECTED  135 tablet  1   No current facility-administered medications for this visit.   History   Social History Narrative   Lives with ex-husband and children in [place]. No smoking, no etoh, disability    ROS: A comprehensive Review of Systems - Negative except  Occasional positional dizziness.  PHYSICAL EXAM BP 122/82  Pulse 52  Ht 5\' 2"  (1.575 m)  Wt 122 lb 14.4 oz (55.747 kg)  BMI 22.47 kg/m2 General appearance: alert, cooperative, appears stated age, no distress and Well-nourished and well-groomed. Pleasant mood and affect. Otherwise healthy-appearing Neck: no adenopathy, no carotid bruit, no JVD and supple, symmetrical, trachea midline Lungs: clear to auscultation bilaterally, normal percussion bilaterally and Nonlabored, good air movement Heart: regularly irregular rhythm, S1: normal prosthetic S1, S2: normal and Soft 1-2/6 SEM at RUSB. No R./G. Nondisplaced PMI. Abdomen: soft, non-tender; bowel sounds normal; no masses,  no organomegaly Extremities: extremities normal, atraumatic, no cyanosis or edema Pulses: 2+ and symmetric Neurologic: Alert and oriented X 3, normal strength and tone. Normal symmetric reflexes. Normal coordination and gait HEENT: [place]/AT, EOMI, MMM, anicteric sclera MSK: Palpation along the left costal margin underneath the left breast on the rib cage reproduces significant discomfort in several spots. Just simply touching the area elicits discomfort. Not noted on the left upper chest or in the right side of the parasternal margin..  [id number] today: Yes Rate: 52 , Rhythm: Sinus bradycardia with first AV block. Otherwise normal. Recent Labs: No recent labs beyond INR checks  ASSESSMENT / PLAN: Discomfort of chest wall The symptoms she describing and the reproducibility on exam are quite consistent with costochondritis type pain. I do not think this is cardiac in nature. Recommend treatment with NSAIDs and Tylenol. With her being on Coumadin I am reluctant to recommend high-dose NSAIDs. Would opt for when necessary acetaminophen/Tylenol  S/P aortic valve replacement She sees her doing fine overall from her valve standpoint. She doesn't predict scar for redo surgeries. She is due to have a cardiac following her last  visit with me a year ago. We're now 4 years out from her redo-surgery.  Plan: Check 2-D echocardiogram. If this study still looks fine, continue plan to followup in 1 year.  HYPERLIPIDEMIA  Monitored by PCP. I do not have any recent labs.  Long term (current) use of anticoagulants Warfarin followed here at [facility] office of [facility]. While on warfarin, I would not recommend using high-dose NSAIDs for analgesic.    Orders Placed This Encounter  Procedures  . EKG 12-Lead  . 2D Echocardiogram without contrast    S/p  Valve repair    Standing Status: Future     Number of Occurrences:      Standing Expiration Date: 02/01/2014    Order Specific Question:  Type of Echo    Answer:  Complete    Order Specific Question:  Where should this test be performed    Answer:  [facility]    Order Specific Question:  Reason for exam-Echo    Answer:  Aortic Valve Disorder 424.1   No orders of the defined types were placed in this encounter.    Followup: One year  Charmion Hapke W. Darrion Macaulay,  M.D., M.S. [facility] [address] [place], [place]  [address]  [phone] Pager # [phone]

## 2013-02-01 NOTE — Patient Instructions (Signed)
Your physician wants you to follow-up in 12 month Dr Herbie Baltimore.  You will receive a reminder letter in the mail two months in advance. If you don't receive a letter, please call our office to schedule the follow-up appointment.  Your physician has requested that you have an echocardiogram. Echocardiography is a painless test that uses sound waves to create images of your heart. It provides your doctor with information about the size and shape of your heart and how well your heart's chambers and valves are working. This procedure takes approximately one hour. There are no restrictions for this procedure.

## 2013-02-03 ENCOUNTER — Encounter: Payer: Self-pay | Admitting: Cardiology

## 2013-02-03 DIAGNOSIS — R0789 Other chest pain: Secondary | ICD-10-CM | POA: Insufficient documentation

## 2013-02-03 DIAGNOSIS — Z952 Presence of prosthetic heart valve: Secondary | ICD-10-CM | POA: Insufficient documentation

## 2013-02-03 NOTE — Assessment & Plan Note (Signed)
She sees her doing fine overall from her valve standpoint. She doesn't predict scar for redo surgeries. She is due to have a cardiac following her last visit with me a year ago. We're now 4 years out from her redo-surgery.  Plan: Check 2-D echocardiogram. If this study still looks fine, continue plan to followup in 1 year.

## 2013-02-03 NOTE — Assessment & Plan Note (Addendum)
This is previously Monitored by PCP. I do not have any recent labs. Unfortunately it appears that she does not have an active PCP. At best, she comes in follow up.. If she does not have a PCP at that time, would like to check screening labs.

## 2013-02-03 NOTE — Assessment & Plan Note (Signed)
The symptoms she describing and the reproducibility on exam are quite consistent with costochondritis type pain. I do not think this is cardiac in nature. Recommend treatment with NSAIDs and Tylenol. With her being on Coumadin I am reluctant to recommend high-dose NSAIDs. Would opt for when necessary acetaminophen/Tylenol

## 2013-02-03 NOTE — Assessment & Plan Note (Signed)
Warfarin followed here at [facility] office of [facility]. While on warfarin, I would not recommend using high-dose NSAIDs for analgesic.

## 2013-02-20 ENCOUNTER — Ambulatory Visit (HOSPITAL_COMMUNITY): Payer: Medicaid Other

## 2013-03-06 ENCOUNTER — Ambulatory Visit (INDEPENDENT_AMBULATORY_CARE_PROVIDER_SITE_OTHER): Payer: Medicaid Other | Admitting: Pharmacist Clinician (PhC)/ Clinical Pharmacy Specialist

## 2013-03-06 ENCOUNTER — Inpatient Hospital Stay (HOSPITAL_COMMUNITY): Admission: RE | Admit: 2013-03-06 | Payer: Medicaid Other | Source: Ambulatory Visit

## 2013-03-06 VITALS — BP 120/80 | HR 56

## 2013-03-06 DIAGNOSIS — Z7901 Long term (current) use of anticoagulants: Secondary | ICD-10-CM

## 2013-03-06 DIAGNOSIS — Z954 Presence of other heart-valve replacement: Secondary | ICD-10-CM

## 2013-03-06 LAB — POCT INR: INR: 2

## 2013-03-19 ENCOUNTER — Ambulatory Visit (HOSPITAL_COMMUNITY)
Admission: RE | Admit: 2013-03-19 | Discharge: 2013-03-19 | Disposition: A | Discharge status: Home / Self Care | Payer: Medicaid Other | Source: Ambulatory Visit | Attending: Cardiology | Admitting: Cardiology

## 2013-03-19 DIAGNOSIS — Z952 Presence of prosthetic heart valve: Secondary | ICD-10-CM

## 2013-03-19 DIAGNOSIS — I359 Nonrheumatic aortic valve disorder, unspecified: Secondary | ICD-10-CM

## 2013-03-19 DIAGNOSIS — I079 Rheumatic tricuspid valve disease, unspecified: Secondary | ICD-10-CM | POA: Insufficient documentation

## 2013-03-19 DIAGNOSIS — E785 Hyperlipidemia, unspecified: Secondary | ICD-10-CM | POA: Insufficient documentation

## 2013-03-19 DIAGNOSIS — I080 Rheumatic disorders of both mitral and aortic valves: Secondary | ICD-10-CM | POA: Insufficient documentation

## 2013-03-19 DIAGNOSIS — R0789 Other chest pain: Secondary | ICD-10-CM

## 2013-03-19 DIAGNOSIS — R079 Chest pain, unspecified: Secondary | ICD-10-CM | POA: Insufficient documentation

## 2013-03-19 DIAGNOSIS — I379 Nonrheumatic pulmonary valve disorder, unspecified: Secondary | ICD-10-CM | POA: Insufficient documentation

## 2013-03-19 HISTORY — PX: TRANSTHORACIC ECHOCARDIOGRAM: SHX275

## 2013-03-19 NOTE — Progress Notes (Signed)
2D Echo Performed 03/19/2013    Lancer Thurner, RCS  

## 2013-03-20 ENCOUNTER — Encounter: Payer: Self-pay | Admitting: Cardiology

## 2013-03-21 ENCOUNTER — Encounter: Payer: Self-pay | Admitting: *Deleted

## 2013-03-27 ENCOUNTER — Ambulatory Visit (INDEPENDENT_AMBULATORY_CARE_PROVIDER_SITE_OTHER): Payer: Medicaid Other | Admitting: Pharmacist Clinician (PhC)/ Clinical Pharmacy Specialist

## 2013-03-27 DIAGNOSIS — Z954 Presence of other heart-valve replacement: Secondary | ICD-10-CM

## 2013-03-27 DIAGNOSIS — Z7901 Long term (current) use of anticoagulants: Secondary | ICD-10-CM

## 2013-03-27 LAB — POCT INR: INR: 3.6

## 2013-04-24 ENCOUNTER — Ambulatory Visit (INDEPENDENT_AMBULATORY_CARE_PROVIDER_SITE_OTHER): Payer: Medicaid Other | Admitting: Pharmacist Clinician (PhC)/ Clinical Pharmacy Specialist

## 2013-04-24 VITALS — BP 100/70 | HR 92

## 2013-04-24 DIAGNOSIS — Z7901 Long term (current) use of anticoagulants: Secondary | ICD-10-CM

## 2013-04-24 DIAGNOSIS — Z954 Presence of other heart-valve replacement: Secondary | ICD-10-CM

## 2013-04-24 LAB — POCT INR: INR: 3.8

## 2013-05-23 ENCOUNTER — Ambulatory Visit (INDEPENDENT_AMBULATORY_CARE_PROVIDER_SITE_OTHER): Payer: Medicaid Other | Admitting: Pharmacist Clinician (PhC)/ Clinical Pharmacy Specialist

## 2013-05-23 DIAGNOSIS — Z954 Presence of other heart-valve replacement: Secondary | ICD-10-CM

## 2013-05-23 DIAGNOSIS — Z7901 Long term (current) use of anticoagulants: Secondary | ICD-10-CM

## 2013-05-23 LAB — POCT INR: INR: 2.5

## 2013-06-20 ENCOUNTER — Ambulatory Visit (INDEPENDENT_AMBULATORY_CARE_PROVIDER_SITE_OTHER): Payer: Medicaid Other | Admitting: Pharmacist Clinician (PhC)/ Clinical Pharmacy Specialist

## 2013-06-20 DIAGNOSIS — Z7901 Long term (current) use of anticoagulants: Secondary | ICD-10-CM

## 2013-06-20 DIAGNOSIS — Z954 Presence of other heart-valve replacement: Secondary | ICD-10-CM

## 2013-06-20 LAB — POCT INR: INR: 3.5

## 2013-07-18 ENCOUNTER — Ambulatory Visit: Payer: Medicaid Other | Admitting: Pharmacist Clinician (PhC)/ Clinical Pharmacy Specialist

## 2013-07-25 ENCOUNTER — Ambulatory Visit (INDEPENDENT_AMBULATORY_CARE_PROVIDER_SITE_OTHER): Payer: Medicaid Other | Admitting: Pharmacist Clinician (PhC)/ Clinical Pharmacy Specialist

## 2013-07-25 DIAGNOSIS — Z954 Presence of other heart-valve replacement: Secondary | ICD-10-CM

## 2013-07-25 DIAGNOSIS — Z7901 Long term (current) use of anticoagulants: Secondary | ICD-10-CM

## 2013-07-25 LAB — POCT INR: INR: 3.4

## 2013-08-20 ENCOUNTER — Other Ambulatory Visit: Payer: Self-pay | Admitting: Pharmacist Clinician (PhC)/ Clinical Pharmacy Specialist

## 2013-08-20 MED ORDER — WARFARIN SODIUM 3 MG PO TABS: ORAL_TABLET | ORAL | Status: DC

## 2013-08-21 ENCOUNTER — Other Ambulatory Visit: Payer: Self-pay

## 2013-08-22 ENCOUNTER — Ambulatory Visit (INDEPENDENT_AMBULATORY_CARE_PROVIDER_SITE_OTHER): Payer: Medicaid Other | Admitting: Pharmacist Clinician (PhC)/ Clinical Pharmacy Specialist

## 2013-08-22 DIAGNOSIS — Z954 Presence of other heart-valve replacement: Secondary | ICD-10-CM

## 2013-08-22 DIAGNOSIS — Z7901 Long term (current) use of anticoagulants: Secondary | ICD-10-CM

## 2013-08-22 LAB — POCT INR: INR: 3.7

## 2013-08-22 MED ORDER — WARFARIN SODIUM 3 MG PO TABS: ORAL_TABLET | ORAL | Status: DC

## 2013-09-19 ENCOUNTER — Ambulatory Visit: Payer: Medicaid Other | Admitting: Pharmacist Clinician (PhC)/ Clinical Pharmacy Specialist

## 2013-09-21 ENCOUNTER — Ambulatory Visit (INDEPENDENT_AMBULATORY_CARE_PROVIDER_SITE_OTHER): Payer: Medicaid Other | Admitting: Pharmacist Clinician (PhC)/ Clinical Pharmacy Specialist

## 2013-09-21 DIAGNOSIS — Z954 Presence of other heart-valve replacement: Secondary | ICD-10-CM

## 2013-09-21 DIAGNOSIS — Z7901 Long term (current) use of anticoagulants: Secondary | ICD-10-CM

## 2013-09-21 LAB — POCT INR: INR: 2.2

## 2013-10-17 ENCOUNTER — Ambulatory Visit (INDEPENDENT_AMBULATORY_CARE_PROVIDER_SITE_OTHER): Payer: Medicaid Other | Admitting: Pharmacist Clinician (PhC)/ Clinical Pharmacy Specialist

## 2013-10-17 DIAGNOSIS — Z954 Presence of other heart-valve replacement: Secondary | ICD-10-CM

## 2013-10-17 DIAGNOSIS — Z7901 Long term (current) use of anticoagulants: Secondary | ICD-10-CM

## 2013-10-17 LAB — POCT INR: INR: 3.4

## 2013-10-19 ENCOUNTER — Ambulatory Visit: Payer: Medicaid Other | Admitting: Pharmacist Clinician (PhC)/ Clinical Pharmacy Specialist

## 2013-11-07 ENCOUNTER — Encounter (HOSPITAL_COMMUNITY): Payer: Self-pay | Admitting: Emergency Medicine

## 2013-11-07 ENCOUNTER — Emergency Department (HOSPITAL_COMMUNITY)
Admission: EM | Admit: 2013-11-07 | Discharge: 2013-11-08 | Disposition: A | Discharge status: Home / Self Care | Payer: Medicaid Other | Attending: Emergency Medicine | Admitting: Emergency Medicine

## 2013-11-07 DIAGNOSIS — Z7901 Long term (current) use of anticoagulants: Secondary | ICD-10-CM | POA: Insufficient documentation

## 2013-11-07 DIAGNOSIS — Z8679 Personal history of other diseases of the circulatory system: Secondary | ICD-10-CM | POA: Insufficient documentation

## 2013-11-07 DIAGNOSIS — Z8742 Personal history of other diseases of the female genital tract: Secondary | ICD-10-CM | POA: Diagnosis not present

## 2013-11-07 DIAGNOSIS — N390 Urinary tract infection, site not specified: Secondary | ICD-10-CM | POA: Insufficient documentation

## 2013-11-07 DIAGNOSIS — Z862 Personal history of diseases of the blood and blood-forming organs and certain disorders involving the immune mechanism: Secondary | ICD-10-CM | POA: Diagnosis not present

## 2013-11-07 DIAGNOSIS — L03019 Cellulitis of unspecified finger: Secondary | ICD-10-CM | POA: Diagnosis not present

## 2013-11-07 DIAGNOSIS — Z954 Presence of other heart-valve replacement: Secondary | ICD-10-CM | POA: Diagnosis not present

## 2013-11-07 DIAGNOSIS — L03011 Cellulitis of right finger: Secondary | ICD-10-CM

## 2013-11-07 DIAGNOSIS — Z8639 Personal history of other endocrine, nutritional and metabolic disease: Secondary | ICD-10-CM | POA: Diagnosis not present

## 2013-11-07 DIAGNOSIS — R109 Unspecified abdominal pain: Secondary | ICD-10-CM | POA: Insufficient documentation

## 2013-11-07 DIAGNOSIS — Z87442 Personal history of urinary calculi: Secondary | ICD-10-CM | POA: Insufficient documentation

## 2013-11-07 DIAGNOSIS — Z3202 Encounter for pregnancy test, result negative: Secondary | ICD-10-CM | POA: Diagnosis not present

## 2013-11-07 LAB — URINALYSIS, ROUTINE W REFLEX MICROSCOPIC
Bilirubin Urine: NEGATIVE
GLUCOSE, UA: NEGATIVE mg/dL
Ketones, ur: NEGATIVE mg/dL
NITRITE: POSITIVE — AB
Protein, ur: NEGATIVE mg/dL
SPECIFIC GRAVITY, URINE: 1.014 (ref 1.005–1.030)
Urobilinogen, UA: 1 mg/dL (ref 0.0–1.0)
pH: 7 (ref 5.0–8.0)

## 2013-11-07 LAB — URINE MICROSCOPIC-ADD ON

## 2013-11-07 LAB — BASIC METABOLIC PANEL
Anion gap: 14 (ref 5–15)
BUN: 10 mg/dL (ref 6–23)
CO2: 19 mEq/L (ref 19–32)
Calcium: 9.4 mg/dL (ref 8.4–10.5)
Chloride: 105 mEq/L (ref 96–112)
Creatinine, Ser: 0.8 mg/dL (ref 0.50–1.10)
GFR calc Af Amer: >90 mL/min (ref >90)
GFR, EST NON AFRICAN AMERICAN: 86 mL/min — AB (ref >90)
GLUCOSE: 108 mg/dL — AB (ref 70–99)
Potassium: 3.8 mEq/L (ref 3.7–5.3)
SODIUM: 138 meq/L (ref 137–147)

## 2013-11-07 LAB — CBC
HCT: 30.7 % — ABNORMAL LOW (ref 36.0–46.0)
Hemoglobin: 9.7 g/dL — ABNORMAL LOW (ref 12.0–15.0)
MCH: 23.8 pg — AB (ref 26.0–34.0)
MCHC: 31.6 g/dL (ref 30.0–36.0)
MCV: 75.4 fL — AB (ref 78.0–100.0)
Platelets: 300 10*3/uL (ref 150–400)
RBC: 4.07 MIL/uL (ref 3.87–5.11)
RDW: 15.8 % — ABNORMAL HIGH (ref 11.5–15.5)
WBC: 7 10*3/uL (ref 4.0–10.5)

## 2013-11-07 LAB — POC URINE PREG, ED: Preg Test, Ur: NEGATIVE

## 2013-11-07 MED ORDER — LIDOCAINE HCL 2 % IJ SOLN
0.0000 mL | Freq: Once | INTRAMUSCULAR | Status: AC | PRN
  Administered 2013-11-07: 400 mg via INTRADERMAL
  Filled 2013-11-07: qty 20

## 2013-11-07 NOTE — ED Provider Notes (Signed)
CSN: [id number]     Arrival date & time 11/07/13  2115 History   First MD Initiated Contact with Patient 11/07/13 2210     Chief Complaint  Patient presents with  . Flank Pain     (Consider location/radiation/quality/duration/timing/severity/associated sxs/prior Treatment) HPI Tonya Mcintosh is a 48 y.o. female with PMH of aortic valve replaced, hyperlipidemia, VSD, endocarditis, on warfarin, nephrolithiasis presenting with left lower back pain that started 2 days ago and is decribed as an ache. It is getting worse and worse with movement. Patient has tried tylenol without relief. Patient denies fevers, chills, night sweats, weight loss or history of Ca. Patient without loss of bladder or bowel. No saddle anesthesia. No weakness, or numbness or tingling. Patient also has paronchyia to right second finger. No urinary complaints.   Past Medical History  Diagnosis Date  . Aortic valve replaced 1987; September 2010  . Abnormal vaginal Pap smear     CIN I  . Hypercholesterolemia   . VSD (ventricular septal defect) 1979    repair  . Hyperlipidemia   . Kidney calculi   . Kidney infection   . 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  . H/O insomnia    Past Surgical History  Procedure Laterality Date  . Aortic valve replacement  07/17/1985    23-mm Medtronic Hall prosthesis  . Vsd repair  02-22-77  . Cardiac catheterization  01/25/1985    Preop 10 2010, no significant CAD.  Marland Kitchen Cardiovascular stress test  09/22/2008    Fixed defect at the apex. No stress induced ischemia.  . 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 Jude tactile prosthesis (bileaflet)  . Transthoracic echocardiogram  12/25/2008    EF >55%, doppler evidence of regurg is probably normal of St Jude prosthetic aortic valve.  . Transthoracic echocardiogram  03/19/2013    EF 60-65%; ? Grade 3 diastolic dysfunction; Mech Prosthesis of AoV  -mild-mod AI. Mildly increased gradient.   No family history on file. History  Substance Use Topics  . Smoking status: Never Smoker   . Smokeless tobacco: Not on file  . Alcohol Use: No   OB History   Grav Para Term Preterm Abortions TAB SAB Ect Mult Living                 Review of Systems  Constitutional: Negative for fever and chills.  HENT: Negative for congestion and rhinorrhea.   Eyes: Negative for visual disturbance.  Respiratory: Negative for cough and shortness of breath.   Cardiovascular: Negative for chest pain and palpitations.  Gastrointestinal: Negative for nausea, vomiting and diarrhea.  Genitourinary: Negative for dysuria and hematuria.  Musculoskeletal: Positive for back pain and gait problem.  Skin: Negative for rash.  Neurological: Negative for weakness and headaches.      Allergies  Codeine  Home Medications   Prior to Admission medications   Medication Sig Start Date End Date Taking? Authorizing Provider  acetaminophen (TYLENOL) 500 MG tablet Take 1 tablet (500 mg total) by mouth every 6 (six) hours as needed for pain. 10/15/12  Yes Domenic Moras, PA-C  warfarin (COUMADIN) 3 MG tablet Take 3-4.5 mg by mouth daily. 3mg  (1 tab) daily except 4.5mg  (1.5 tabs) on Monday and Friday.   Yes Historical Provider, MD  cephALEXin (KEFLEX) 500 MG capsule Take 1 capsule (500 mg total) by mouth 4 (four) times daily. 11/08/13   [place]  Glendel Jaggers, PA-C   BP 116/69  Pulse 77  Temp(Src) 98.7 F (37.1 C) (Oral)  Resp 18  SpO2 100%  LMP 10/24/2013 Physical Exam  Nursing note and vitals reviewed. Constitutional: She appears well-developed and well-nourished. No distress.  HENT:  Head: Normocephalic and atraumatic.  Eyes: Conjunctivae are normal. Right eye exhibits no discharge. Left eye exhibits no discharge.  Cardiovascular: Normal rate, regular rhythm and normal heart sounds.   Pulmonary/Chest: Effort normal and breath sounds normal. No respiratory distress. She has  no wheezes.  Abdominal: Soft. Bowel sounds are normal. She exhibits no distension. There is no tenderness.  Musculoskeletal:  No midline back tenderness, step off or crepitus. Right Left sided lower back tenderness. No CVA tenderness.   Neurological: She is alert. Coordination normal.  Equal muscle tone. 5/5 strength in lower extremities. DTR equal and intact. Negative straight leg test. Antalgic gait.   Skin: Skin is warm and dry. She is not diaphoretic.    ED Course  Procedures (including critical care time) Labs Review Labs Reviewed  BASIC METABOLIC PANEL - Abnormal; Notable for the following:    Glucose, Bld 108 (*)    GFR calc non Af Amer 86 (*)    All other components within normal limits  CBC - Abnormal; Notable for the following:    Hemoglobin 9.7 (*)    HCT 30.7 (*)    MCV 75.4 (*)    MCH 23.8 (*)    RDW 15.8 (*)    All other components within normal limits  URINALYSIS, ROUTINE W REFLEX MICROSCOPIC - Abnormal; Notable for the following:    APPearance CLOUDY (*)    Hgb urine dipstick MODERATE (*)    Nitrite POSITIVE (*)    Leukocytes, UA MODERATE (*)    All other components within normal limits  URINE MICROSCOPIC-ADD ON - Abnormal; Notable for the following:    Bacteria, UA MANY (*)    All other components within normal limits  PROTIME-INR - Abnormal; Notable for the following:    Prothrombin Time 28.4 (*)    INR 2.67 (*)    All other components within normal limits  URINE CULTURE  POC URINE PREG, ED    Imaging Review No results found.   EKG Interpretation None     INCISION AND DRAINAGE Paronychia  Performed by: Pura Spice Consent: Verbal consent obtained. Risks and benefits: risks, benefits and alternatives were discussed Type: abscess  Body area: right second finger  Anesthesia: local infiltration  Incision was made with a scalpel.  Local anesthetic: lidocaine 2% with epinephrine  Anesthetic total: 5 ml, digital block  Complexity:  complex  Drainage: purulent  Drainage amount: 1-70ml  Patient tolerance: Patient tolerated the procedure well with no immediate complications.    Meds given in ED:  Medications  lidocaine (XYLOCAINE) 2 % (with pres) injection 0-400 mg (400 mg Intradermal Given by Other 11/07/13 2345)    New Prescriptions   CEPHALEXIN (KEFLEX) 500 MG CAPSULE    Take 1 capsule (500 mg total) by mouth 4 (four) times daily.      MDM   Final diagnoses:  UTI (lower urinary tract infection)  Paronychia, right   Pt has been diagnosed with a UTI. Pt is afebrile, right low back tenderness without CVA tenderness, normotensive, and denies N/V. Pt to be dc home with antibiotics and instructions to follow up with PCP if symptoms persist. Patient also without red flags for back pain. Patient with baseline INR. Patient is afebrile, nontoxic, and  in no acute distress. Patient is appropriate for outpatient management and is stable for discharge. Follow up with PCP in a week. Patient with paronychia which was I&D in ED without complications. Warm soaks and no indications for abx.   Discussed return precautions with patient. Discussed all results and patient verbalizes understanding and agrees with plan.  Case has been discussed with Dr. Alvino Chapel who agrees with the above plan to discharge.      Pura Spice, PA-C 11/08/13 [phone]

## 2013-11-07 NOTE — ED Notes (Signed)
Pt reports L flank pain x 2 days.

## 2013-11-07 NOTE — ED Notes (Signed)
PA at bedside for procedure.

## 2013-11-07 NOTE — ED Notes (Signed)
patient ambulated with assistance to restroom.

## 2013-11-07 NOTE — ED Notes (Signed)
Patient c/o L flank pain, onset 1800. Patient denies taking any pain medication today, states she took Extra Strength Tylenol yesterday. Denies difficulty with voiding. Patient states she has a history of kidney stones, states this does not feel the same.

## 2013-11-08 LAB — PROTIME-INR
INR: 2.67 — ABNORMAL HIGH (ref 0.00–1.49)
Prothrombin Time: 28.4 seconds — ABNORMAL HIGH (ref 11.6–15.2)

## 2013-11-08 MED ORDER — CEPHALEXIN 500 MG PO CAPS: 500.0000 mg | ORAL_CAPSULE | Freq: Four times a day (QID) | ORAL | Status: DC

## 2013-11-08 NOTE — Discharge Instructions (Signed)
Return to the emergency room with worsening of symptoms, new symptoms or with symptoms that are concerning, fevers, mid back pain, nausea, vomiting, unable to keep fluids or solids down. Please call your doctor for a followup appointment within 24-48 hours. When you talk to your doctor please let them know that you were seen in the emergency department and have them acquire all of your records so that they can discuss the findings with you and formulate a treatment plan to fully care for your new and ongoing problems. If you do not have a primary care provider please call the number below under ED resources to establish care with a provider and follow up.  Please take all of your antibiotics until finished!   You may develop abdominal discomfort or diarrhea from the antibiotic.  You may help offset this with probiotics which you can buy or get in yogurt. Do not eat  or take the probiotics until 2 hours after your antibiotic.

## 2013-11-08 NOTE — ED Notes (Signed)
Sterile guaze and bandaid applied to left fore finger.

## 2013-11-09 NOTE — ED Provider Notes (Signed)
Medical screening examination/treatment/procedure(s) were performed by non-physician practitioner and as supervising physician I was immediately available for consultation/collaboration.   EKG Interpretation None       Somaya Grassi R. Lavelle Akel, MD 11/09/13 0007 

## 2013-11-11 LAB — URINE CULTURE: Colony Count: >=100000

## 2013-11-12 ENCOUNTER — Telehealth (HOSPITAL_BASED_OUTPATIENT_CLINIC_OR_DEPARTMENT_OTHER): Payer: Self-pay | Admitting: Emergency Medicine

## 2013-11-12 ENCOUNTER — Telehealth (HOSPITAL_BASED_OUTPATIENT_CLINIC_OR_DEPARTMENT_OTHER): Payer: Self-pay

## 2013-11-12 NOTE — Telephone Encounter (Signed)
Post ED Visit - Positive Culture Follow-up  Culture report reviewed by antimicrobial stewardship pharmacist: []  Wes Newport, Pharm.D., BCPS []  Heide Guile, Pharm.D., BCPS []  Alycia Rossetti, Pharm.D., BCPS []  [place], Pharm.D., BCPS, AAHIVP []  Legrand Como, Pharm.D., BCPS, AAHIVP [x]  Carly Sabat, Pharm.D. []  Elenor Quinones, Pharm.D.  Positive urine culture >100,000 colonies/ml E. Coli Treated with Cephalexin 500mg  po caps qid x 10 days, organism sensitive to the same and no further patient follow-up is required at this time.  Hazle Nordmann 11/12/2013, 4:35 PM

## 2013-11-12 NOTE — Telephone Encounter (Signed)
Post ED Visit - Positive Culture Follow-up  Culture report reviewed by antimicrobial stewardship pharmacist: []  Wes [place], Pharm.D., BCPS []  Heide Guile, Pharm.D., BCPS []  Alycia Rossetti, Pharm.D., BCPS [x]  [place], Pharm.D., BCPS, AAHIVP []  Legrand Como, Pharm.D., BCPS, AAHIVP []  Carly Sabat, Pharm.D. []  Elenor Quinones, Pharm.D.  Positive Urine culture, >/= 100,000 colonies -> E Coli Treated with Cephalexin, organism sensitive to the same and no further patient follow-up is required at this time.  Dortha Kern 11/12/2013, 5:46 AM

## 2013-11-14 ENCOUNTER — Ambulatory Visit (INDEPENDENT_AMBULATORY_CARE_PROVIDER_SITE_OTHER): Payer: Medicaid Other | Admitting: Pharmacist Clinician (PhC)/ Clinical Pharmacy Specialist

## 2013-11-14 DIAGNOSIS — Z954 Presence of other heart-valve replacement: Secondary | ICD-10-CM

## 2013-11-14 DIAGNOSIS — Z7901 Long term (current) use of anticoagulants: Secondary | ICD-10-CM

## 2013-11-14 LAB — POCT INR: INR: 2.2

## 2013-12-07 ENCOUNTER — Ambulatory Visit: Payer: Medicaid Other | Admitting: Pharmacist Clinician (PhC)/ Clinical Pharmacy Specialist

## 2013-12-12 ENCOUNTER — Ambulatory Visit (INDEPENDENT_AMBULATORY_CARE_PROVIDER_SITE_OTHER): Payer: Medicaid Other | Admitting: Pharmacist Clinician (PhC)/ Clinical Pharmacy Specialist

## 2013-12-12 DIAGNOSIS — Z7901 Long term (current) use of anticoagulants: Secondary | ICD-10-CM | POA: Diagnosis not present

## 2013-12-12 DIAGNOSIS — Z954 Presence of other heart-valve replacement: Secondary | ICD-10-CM | POA: Diagnosis not present

## 2013-12-12 DIAGNOSIS — Z952 Presence of prosthetic heart valve: Secondary | ICD-10-CM

## 2013-12-12 LAB — POCT INR: INR: 3.8

## 2013-12-26 ENCOUNTER — Ambulatory Visit: Payer: Medicaid Other | Admitting: Pharmacist Clinician (PhC)/ Clinical Pharmacy Specialist

## 2014-01-01 ENCOUNTER — Telehealth: Payer: Self-pay | Admitting: Cardiology

## 2014-01-01 NOTE — Telephone Encounter (Signed)
Close encounter 

## 2014-01-04 ENCOUNTER — Ambulatory Visit (INDEPENDENT_AMBULATORY_CARE_PROVIDER_SITE_OTHER): Payer: Medicaid Other | Admitting: Pharmacist Clinician (PhC)/ Clinical Pharmacy Specialist

## 2014-01-04 DIAGNOSIS — Z7901 Long term (current) use of anticoagulants: Secondary | ICD-10-CM

## 2014-01-04 DIAGNOSIS — Z954 Presence of other heart-valve replacement: Secondary | ICD-10-CM

## 2014-01-04 DIAGNOSIS — Z952 Presence of prosthetic heart valve: Secondary | ICD-10-CM

## 2014-01-04 LAB — POCT INR: INR: 1.7

## 2014-01-25 ENCOUNTER — Telehealth: Payer: Self-pay | Admitting: Internal Medicine

## 2014-01-25 ENCOUNTER — Ambulatory Visit (INDEPENDENT_AMBULATORY_CARE_PROVIDER_SITE_OTHER): Payer: Medicaid Other | Admitting: Pharmacist Clinician (PhC)/ Clinical Pharmacy Specialist

## 2014-01-25 DIAGNOSIS — Z952 Presence of prosthetic heart valve: Secondary | ICD-10-CM

## 2014-01-25 DIAGNOSIS — Z7901 Long term (current) use of anticoagulants: Secondary | ICD-10-CM

## 2014-01-25 DIAGNOSIS — Z954 Presence of other heart-valve replacement: Secondary | ICD-10-CM

## 2014-01-25 LAB — POCT INR: INR: 2

## 2014-01-25 NOTE — Telephone Encounter (Signed)
Error

## 2014-02-20 ENCOUNTER — Ambulatory Visit: Payer: Medicaid Other | Admitting: Pharmacist Clinician (PhC)/ Clinical Pharmacy Specialist

## 2014-03-04 ENCOUNTER — Ambulatory Visit: Payer: Medicaid Other | Admitting: Pharmacist Clinician (PhC)/ Clinical Pharmacy Specialist

## 2014-03-13 ENCOUNTER — Ambulatory Visit: Payer: Medicaid Other | Admitting: Cardiology

## 2014-03-13 ENCOUNTER — Ambulatory Visit: Payer: Medicaid Other | Admitting: Pharmacist Clinician (PhC)/ Clinical Pharmacy Specialist

## 2014-05-29 ENCOUNTER — Ambulatory Visit (INDEPENDENT_AMBULATORY_CARE_PROVIDER_SITE_OTHER): Payer: Medicaid Other | Admitting: Pharmacist Clinician (PhC)/ Clinical Pharmacy Specialist

## 2014-05-29 DIAGNOSIS — Z952 Presence of prosthetic heart valve: Secondary | ICD-10-CM

## 2014-05-29 DIAGNOSIS — Z954 Presence of other heart-valve replacement: Secondary | ICD-10-CM

## 2014-05-29 DIAGNOSIS — Z7901 Long term (current) use of anticoagulants: Secondary | ICD-10-CM

## 2014-05-29 LAB — POCT INR: INR: 3.6

## 2014-06-19 ENCOUNTER — Ambulatory Visit: Payer: Medicaid Other | Admitting: Pharmacist Clinician (PhC)/ Clinical Pharmacy Specialist

## 2014-06-21 ENCOUNTER — Ambulatory Visit: Payer: Medicaid Other | Admitting: Pharmacist Clinician (PhC)/ Clinical Pharmacy Specialist

## 2014-06-28 ENCOUNTER — Ambulatory Visit (INDEPENDENT_AMBULATORY_CARE_PROVIDER_SITE_OTHER): Payer: Commercial Managed Care - HMO | Admitting: Pharmacist Clinician (PhC)/ Clinical Pharmacy Specialist

## 2014-06-28 ENCOUNTER — Ambulatory Visit (INDEPENDENT_AMBULATORY_CARE_PROVIDER_SITE_OTHER): Payer: Commercial Managed Care - HMO | Admitting: Cardiology

## 2014-06-28 VITALS — BP 128/88 | HR 57 | Ht 61.5 in | Wt 128.0 lb

## 2014-06-28 DIAGNOSIS — R002 Palpitations: Secondary | ICD-10-CM

## 2014-06-28 DIAGNOSIS — R071 Chest pain on breathing: Secondary | ICD-10-CM | POA: Diagnosis not present

## 2014-06-28 DIAGNOSIS — Z954 Presence of other heart-valve replacement: Secondary | ICD-10-CM

## 2014-06-28 DIAGNOSIS — Z952 Presence of prosthetic heart valve: Secondary | ICD-10-CM

## 2014-06-28 DIAGNOSIS — Z7901 Long term (current) use of anticoagulants: Secondary | ICD-10-CM

## 2014-06-28 DIAGNOSIS — E785 Hyperlipidemia, unspecified: Secondary | ICD-10-CM

## 2014-06-28 DIAGNOSIS — I5032 Chronic diastolic (congestive) heart failure: Secondary | ICD-10-CM

## 2014-06-28 DIAGNOSIS — R0789 Other chest pain: Secondary | ICD-10-CM

## 2014-06-28 LAB — POCT INR: INR: 5.5

## 2014-06-28 NOTE — Patient Instructions (Addendum)
  schedule for Oct 2016.Your physician has requested that you have an echocardiogram. Echocardiography is a painless test that uses sound waves to create images of your heart. It provides your doctor with information about the size and shape of your heart and how well your heart's chambers and valves are working. This procedure takes approximately one hour. There are no restrictions for this procedure.  No change with current medications   The type of heart valve you have is St. Jude aortic mechanical prosthesis ( bileaflet). It was placed 1987.  Your physician wants you to follow-up in Jan 2017 with Dr Ellyn Hack- result of echo.  You will receive a reminder letter in the mail two months in advance. If you don't receive a letter, please call our office to schedule the follow-up appointment.

## 2014-06-28 NOTE — Progress Notes (Signed)
PATIENT: Tonya Mcintosh MRN: [id number]  DOB: Sep 15, 1965   DOV:06/30/2014 PCP: No PCP Per Patient  Clinic Note: Chief Complaint  Patient presents with  . Annual Exam    F/U Echo, pt c/o chest pain on and off   HPI: Tonya Mcintosh is a 49 y.o.  female with a PMH below who presents today for cardiology followup but with a complaint of chest pain. She is a very pleasant woman who has a history of congenital heart disease VSD and aortic regurgitation initially treated in 1980 with VSD closure and aortic valvuloplasty.   1980: VSD closure and aortic valve repair  AVR 1987 with Medtronic bioprosthesis  2006: Endocarditis treated medically  October 2010: Redo AVR with St. Jude bileaflet valve  Echocardiogram January 2015: EF 60-65%. Gr 3 DD?, Mechanical prosthetic aortic valve present with mild-moderate regurgitation. Velocity across the valve is slightly higher than expected.  Interval History: She now presents with a complaint of chest discomfort tenderness along the left costal margin as well as under her left breast radiating along the lower rib cage to the midaxillary line. Is actually a relatively frequent symptom for her that has been going off and on for years. It is a sharp discomfort not pleuritic in nature not made worse in a sharp sense by inspiration, but it is made worse with coughing. It is somewhat positional, worse with lying down. Not associated with exertion. She denies any dyspnea or PND/orthopnea.   She is relatively active doing housework both sweeping and mopping without any chest tightness or pressure or dyspnea. She just simply gets tired after a while. When she gets tired she may feel a few palpitations, but nothing prolonged. She intermittently will have spells of being short of breath, but nothing routine.  The remainder of Cardiovascular ROS: positive for - chest pain, irregular heartbeat and Not a worrisome irregular heartbeat negative for - dyspnea on exertion,  edema, loss of consciousness, orthopnea, palpitations, paroxysmal nocturnal dyspnea, rapid heart rate or shortness of breath, lightheadedness, dizziness, syncope/near-syncope, TIA/amaurosis fugax.  She is on long-standing warfarin - no melena, hematochezia, hematuria or epistaxis.  Past Medical History  Diagnosis Date  . Aortic valve replaced 1987; September 2010  . Abnormal vaginal Pap smear     CIN I  . Hypercholesterolemia   . VSD (ventricular septal defect) 1979    repair  . Hyperlipidemia   . Kidney calculi   . Kidney infection   . 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  . H/O insomnia     Prior Cardiac Evaluation and Past Surgical History: Past Surgical History  Procedure Laterality Date  . Aortic valve replacement  07/17/1985    23-mm Medtronic Hall prosthesis  . Vsd repair  02-22-77  . Cardiac catheterization  01/25/1985    Preop 10 2010, no significant CAD.  Marland Kitchen Cardiovascular stress test  09/22/2008    Fixed defect at the apex. No stress induced ischemia.  . 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 Jude tactile prosthesis (bileaflet)  . Transthoracic echocardiogram  12/25/2008    EF >55%, doppler evidence of regurg is probably normal of St Jude prosthetic aortic valve.  . Transthoracic echocardiogram  03/19/2013    EF 60-65%; ? Grade 3 diastolic dysfunction; Mech Prosthesis of AoV -mild-mod AI. Mildly increased gradient.    Allergies  Allergen Reactions  . Codeine Itching    Current Outpatient  Prescriptions  Medication Sig Dispense Refill  . acetaminophen (TYLENOL) 500 MG tablet Take 1 tablet (500 mg total) by mouth every 6 (six) hours as needed for pain. 30 tablet 0  . warfarin (COUMADIN) 3 MG tablet Take 3-4.5 mg by mouth daily. 3mg  (1 tab) daily except 4.5mg  (1.5 tabs) on Monday and Friday.     No current facility-administered medications for this visit.   History    Social History Narrative   Lives with ex-husband and children in [place]. No smoking, no etoh, disability    ROS: A comprehensive Review of Systems - Negative except Occasional positional dizziness.  Review of Systems  Constitutional: Negative for fever, chills and malaise/fatigue.  HENT: Positive for congestion (Intermittent with the pollen/allergies). Negative for nosebleeds.   Respiratory: Positive for cough (With allergies). Negative for wheezing.   Cardiovascular: Negative for claudication.       Per history of present illness  Gastrointestinal: Positive for heartburn.  Neurological: Positive for dizziness (occasional positional dizziness).  Endo/Heme/Allergies: Does not bruise/bleed easily.  All other systems reviewed and are negative.   PHYSICAL EXAM BP 128/88 mmHg  Pulse 57  Ht 5' 1.5" (1.562 m)  Wt 58.06 kg (128 lb)  BMI 23.80 kg/m2 General appearance: alert, cooperative, appears stated age, no distress and Well-nourished and well-groomed. Pleasant mood and affect. Otherwise healthy-appearing HEENT: [place]/AT, EOMI, MMM, anicteric sclera Neck: no adenopathy, no carotid bruit, no JVD and supple, symmetrical, trachea midline Lungs: clear to auscultation bilaterally, normal percussion bilaterally and Nonlabored, good air movement Heart: regularly irregular rhythm, S1: normal prosthetic S1, S2: normal;  Soft 1-2/6 SEM at RUSB. No R./G. Nondisplaced PMI. Abdomen: soft, non-tender; bowel sounds normal; no masses,  no organomegaly Extremities: extremities normal, atraumatic, no cyanosis or edema Pulses: 2+ and symmetric Neurologic: Alert and oriented X 3, normal strength and tone. Normal symmetric reflexes. Normal coordination and gait  MSK: Palpation along the left costal margin underneath the left breast on the rib cage reproduces significant discomfort in several spots. Just simply touching the area elicits discomfort. Not noted on the left upper chest or in the right side of the  parasternal margin. (Almost essentially the same exam findings as last visit)  [id number] today: Yes Rate: 57 , Rhythm: Sinus bradycardia with first AV block. Nonspecific T wave inversions in leads V1 and V2 Otherwise normal.  Stable EKG. Heart rate is actually improved from last visit.  Recent Labs: No recent labs beyond INR checks  ASSESSMENT / PLAN: Problem List Items Addressed This Visit    Chronic diastolic heart failure, NYHA class 1 (Chronic)    Echocardiogram suggested grade 3 diastolic dysfunction. She has not had any heart failure symptoms. As of now I'm not inclined to add any afterload reduction or diuretics. Monitor for symptoms and adjust accordingly.      Relevant Orders   EKG 12-Lead   Echocardiogram   Discomfort of chest wall (Chronic)    Chronic reproducible symptoms that are consistent with costochondritis and probably chronic because of her multiple heart surgeries. Again recommend treatment with analgesics such as NSAIDs and Tylenol. Would prefer Tylenol over and states based on her being on Coumadin.      Dyslipidemia (Chronic)    She had been on a statin, but is not currently on anything now. This has been followed by her PCP.      Heart palpitations (Chronic)    Much less frequent this time around. She is looking take any medications therefore we'll hold off on  any beta blockade.      Long term current use of anticoagulant therapy (Chronic)    On warfarin monitored here in our clinic. No active bleeding. Will INRs. Would avoid NSAIDs.      S/P aortic valve replacement - Primary (Chronic)    Doing relatively well. The bowel sounds fine. I don't hear much the way of diastolic murmur of AI. Most recent echocardiogram was in 2015.  Follow-up echocardiogram in October 2016. -- Will eventually check every 2 years.      Relevant Orders   EKG 12-Lead   Echocardiogram        Followup: One year  HARDING, Leonie Green, M.D., M.S. Interventional  Cardiologist   Pager # [phone]

## 2014-06-30 ENCOUNTER — Encounter: Payer: Self-pay | Admitting: Cardiology

## 2014-06-30 DIAGNOSIS — R002 Palpitations: Secondary | ICD-10-CM | POA: Insufficient documentation

## 2014-06-30 DIAGNOSIS — I5032 Chronic diastolic (congestive) heart failure: Secondary | ICD-10-CM | POA: Insufficient documentation

## 2014-06-30 NOTE — Assessment & Plan Note (Signed)
Much less frequent this time around. She is looking take any medications therefore we'll hold off on any beta blockade.

## 2014-06-30 NOTE — Assessment & Plan Note (Signed)
Doing relatively well. The bowel sounds fine. I don't hear much the way of diastolic murmur of AI. Most recent echocardiogram was in 2015.  Follow-up echocardiogram in October 2016. -- Will eventually check every 2 years.

## 2014-06-30 NOTE — Assessment & Plan Note (Signed)
She had been on a statin, but is not currently on anything now. This has been followed by her PCP.

## 2014-06-30 NOTE — Assessment & Plan Note (Signed)
Chronic reproducible symptoms that are consistent with costochondritis and probably chronic because of her multiple heart surgeries. Again recommend treatment with analgesics such as NSAIDs and Tylenol. Would prefer Tylenol over and states based on her being on Coumadin.

## 2014-06-30 NOTE — Assessment & Plan Note (Signed)
On warfarin monitored here in our clinic. No active bleeding. Will INRs. Would avoid NSAIDs.

## 2014-06-30 NOTE — Assessment & Plan Note (Signed)
Echocardiogram suggested grade 3 diastolic dysfunction. She has not had any heart failure symptoms. As of now I'm not inclined to add any afterload reduction or diuretics. Monitor for symptoms and adjust accordingly.

## 2014-07-12 ENCOUNTER — Ambulatory Visit (INDEPENDENT_AMBULATORY_CARE_PROVIDER_SITE_OTHER): Payer: Commercial Managed Care - HMO | Admitting: Pharmacist Clinician (PhC)/ Clinical Pharmacy Specialist

## 2014-07-12 DIAGNOSIS — Z7901 Long term (current) use of anticoagulants: Secondary | ICD-10-CM | POA: Diagnosis not present

## 2014-07-12 DIAGNOSIS — Z954 Presence of other heart-valve replacement: Secondary | ICD-10-CM | POA: Diagnosis not present

## 2014-07-12 DIAGNOSIS — Z952 Presence of prosthetic heart valve: Secondary | ICD-10-CM

## 2014-07-12 LAB — POCT INR: INR: 2.9

## 2014-07-15 ENCOUNTER — Other Ambulatory Visit: Payer: Self-pay

## 2014-07-15 DIAGNOSIS — Z1231 Encounter for screening mammogram for malignant neoplasm of breast: Secondary | ICD-10-CM

## 2014-07-19 ENCOUNTER — Emergency Department (HOSPITAL_COMMUNITY): Payer: Commercial Managed Care - HMO

## 2014-07-19 ENCOUNTER — Emergency Department (HOSPITAL_COMMUNITY)
Admission: EM | Admit: 2014-07-19 | Discharge: 2014-07-19 | Disposition: A | Discharge status: Home / Self Care | Payer: Commercial Managed Care - HMO | Attending: Emergency Medicine | Admitting: Emergency Medicine

## 2014-07-19 DIAGNOSIS — R079 Chest pain, unspecified: Secondary | ICD-10-CM

## 2014-07-19 DIAGNOSIS — R0789 Other chest pain: Secondary | ICD-10-CM | POA: Diagnosis not present

## 2014-07-19 DIAGNOSIS — Z9889 Other specified postprocedural states: Secondary | ICD-10-CM | POA: Insufficient documentation

## 2014-07-19 DIAGNOSIS — Z8669 Personal history of other diseases of the nervous system and sense organs: Secondary | ICD-10-CM | POA: Diagnosis not present

## 2014-07-19 DIAGNOSIS — Z8639 Personal history of other endocrine, nutritional and metabolic disease: Secondary | ICD-10-CM | POA: Diagnosis not present

## 2014-07-19 DIAGNOSIS — Z8774 Personal history of (corrected) congenital malformations of heart and circulatory system: Secondary | ICD-10-CM | POA: Diagnosis not present

## 2014-07-19 DIAGNOSIS — Z8679 Personal history of other diseases of the circulatory system: Secondary | ICD-10-CM | POA: Diagnosis not present

## 2014-07-19 DIAGNOSIS — R0602 Shortness of breath: Secondary | ICD-10-CM | POA: Insufficient documentation

## 2014-07-19 DIAGNOSIS — Z87442 Personal history of urinary calculi: Secondary | ICD-10-CM | POA: Diagnosis not present

## 2014-07-19 DIAGNOSIS — Z87448 Personal history of other diseases of urinary system: Secondary | ICD-10-CM | POA: Insufficient documentation

## 2014-07-19 DIAGNOSIS — Z7901 Long term (current) use of anticoagulants: Secondary | ICD-10-CM | POA: Insufficient documentation

## 2014-07-19 LAB — CBC
HCT: 31.4 % — ABNORMAL LOW (ref 36.0–46.0)
HEMOGLOBIN: 9.8 g/dL — AB (ref 12.0–15.0)
MCH: 24.3 pg — ABNORMAL LOW (ref 26.0–34.0)
MCHC: 31.2 g/dL (ref 30.0–36.0)
MCV: 77.9 fL — AB (ref 78.0–100.0)
Platelets: 403 10*3/uL — ABNORMAL HIGH (ref 150–400)
RBC: 4.03 MIL/uL (ref 3.87–5.11)
RDW: 18.3 % — AB (ref 11.5–15.5)
WBC: 4.5 10*3/uL (ref 4.0–10.5)

## 2014-07-19 LAB — PROTIME-INR
INR: 2.32 — ABNORMAL HIGH (ref 0.00–1.49)
Prothrombin Time: 25.2 seconds — ABNORMAL HIGH (ref 11.6–15.2)

## 2014-07-19 LAB — COMPREHENSIVE METABOLIC PANEL
ALT: 11 U/L — ABNORMAL LOW (ref 14–54)
ANION GAP: 7 (ref 5–15)
AST: 20 U/L (ref 15–41)
Albumin: 3.4 g/dL — ABNORMAL LOW (ref 3.5–5.0)
Alkaline Phosphatase: 79 U/L (ref 38–126)
BILIRUBIN TOTAL: 0.3 mg/dL (ref 0.3–1.2)
BUN: 5 mg/dL — ABNORMAL LOW (ref 6–20)
CO2: 21 mmol/L — ABNORMAL LOW (ref 22–32)
CREATININE: 0.8 mg/dL (ref 0.44–1.00)
Calcium: 8.5 mg/dL — ABNORMAL LOW (ref 8.9–10.3)
Chloride: 107 mmol/L (ref 101–111)
GFR calc Af Amer: >60 mL/min (ref >60)
GFR calc non Af Amer: >60 mL/min (ref >60)
Glucose, Bld: 101 mg/dL — ABNORMAL HIGH (ref 65–99)
Potassium: 4.2 mmol/L (ref 3.5–5.1)
SODIUM: 135 mmol/L (ref 135–145)
Total Protein: 8 g/dL (ref 6.5–8.1)

## 2014-07-19 LAB — I-STAT TROPONIN, ED
Troponin i, poc: 0 ng/mL (ref 0.00–0.08)
Troponin i, poc: 0 ng/mL (ref 0.00–0.08)

## 2014-07-19 MED ORDER — NITROGLYCERIN 2 % TD OINT
0.5000 [in_us] | TOPICAL_OINTMENT | Freq: Once | TRANSDERMAL | Status: AC
  Administered 2014-07-19: 0.5 [in_us] via TOPICAL
  Filled 2014-07-19: qty 1

## 2014-07-19 MED ORDER — HYDROMORPHONE HCL 1 MG/ML IJ SOLN
1.0000 mg | Freq: Once | INTRAMUSCULAR | Status: DC
  Filled 2014-07-19: qty 1

## 2014-07-19 MED ORDER — HYDROMORPHONE HCL 1 MG/ML IJ SOLN
0.5000 mg | Freq: Once | INTRAMUSCULAR | Status: AC
  Administered 2014-07-19: 0.5 mg via INTRAVENOUS

## 2014-07-19 MED ORDER — MORPHINE SULFATE 2 MG/ML IJ SOLN: 2.0000 mg | Freq: Once | INTRAMUSCULAR | Status: DC

## 2014-07-19 NOTE — ED Notes (Signed)
Patient returned from xray.

## 2014-07-19 NOTE — ED Notes (Signed)
Pt presents with sudden onset of CP that woke her up from sleep. Pt reports n/ shob and pain with palpation. Denies any radiation of pain, diaphoresis, V. Pt has a significant cardiac history. Pt has nitro X 2 and reports pain decreased 5/10.

## 2014-07-19 NOTE — ED Notes (Signed)
Nitro paste removed per Dr. Wyvonnia Dusky

## 2014-07-19 NOTE — Discharge Instructions (Signed)
Continue your regular home medications. Follow-up with Dr. Ellyn Hack-- call and inform of this ED visit. Return to the ED for new or worsening symptoms.

## 2014-07-19 NOTE — ED Provider Notes (Signed)
CSN: [id number]     Arrival date & time 07/19/14  0746 History   First MD Initiated Contact with Patient 07/19/14 [phone]     Chief Complaint  Patient presents with  . Chest Pain     (Consider location/radiation/quality/duration/timing/severity/associated sxs/prior Treatment) Patient is a 49 y.o. female presenting with chest pain. The history is provided by the patient and medical records.  Chest Pain Associated symptoms: shortness of breath     This is a 49 year old female with past medical history significant for hyperlipidemia, VSD s/p repair, hx of aortic valve replacement currently on coumadin, presenting to the ED for chest pain. Patient states pain began abruptly this morning and woke her from sleep.  Describes pain as a pressure, worse with palpation of chest wall and raising left arm.  She reports associated SOB.  She denies any dizziness, lightheadedness, nausea, vomiting, or diaphoresis.  She was given 2 SL NTG en route with significant improvement of her pain.  Patient is followed by cardiology, Dr. Ellyn Hack.  Denies recent cough, congestion, fever, chills, sweats, or other URI type symptoms.  VSS.   Past Medical History  Diagnosis Date  . Aortic valve replaced 1987; September 2010  . Abnormal vaginal Pap smear     CIN I  . Hypercholesterolemia   . VSD (ventricular septal defect) 1979    repair  . Hyperlipidemia   . Kidney calculi   . Kidney infection   . 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  . H/O insomnia    Past Surgical History  Procedure Laterality Date  . Aortic valve replacement  07/17/1985    23-mm Medtronic Hall prosthesis  . Vsd repair  02-22-77  . Cardiac catheterization  01/25/1985    Preop 10 2010, no significant CAD.  Marland Kitchen Cardiovascular stress test  09/22/2008    Fixed defect at the apex. No stress induced ischemia.  . 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 Jude  tactile prosthesis (bileaflet)  . Transthoracic echocardiogram  12/25/2008    EF >55%, doppler evidence of regurg is probably normal of St Jude prosthetic aortic valve.  . Transthoracic echocardiogram  03/19/2013    EF 60-65%; ? Grade 3 diastolic dysfunction; Mech Prosthesis of AoV -mild-mod AI. Mildly increased gradient.   No family history on file. History  Substance Use Topics  . Smoking status: Never Smoker   . Smokeless tobacco: Not on file  . Alcohol Use: No   OB History    No data available     Review of Systems  Respiratory: Positive for shortness of breath.   Cardiovascular: Positive for chest pain.  All other systems reviewed and are negative.     Allergies  Codeine  Home Medications   Prior to Admission medications   Medication Sig Start Date End Date Taking? Authorizing Provider  acetaminophen (TYLENOL) 500 MG tablet Take 1 tablet (500 mg total) by mouth every 6 (six) hours as needed for pain. 10/15/12   Domenic Moras, PA-C  warfarin (COUMADIN) 3 MG tablet Take 3-4.5 mg by mouth daily. 3mg  (1 tab) daily except 4.5mg  (1.5 tabs) on Monday and Friday.    Historical Provider, MD   BP 116/81 mmHg  Pulse 63  Temp(Src) 98.4 F (36.9 C) (Oral)  Resp 16  SpO2 100%   Physical Exam  Constitutional: She is oriented to person, place, and time. She appears well-developed and well-nourished. No distress.  HENT:  Head: Normocephalic and atraumatic.  Mouth/Throat: Oropharynx is clear and moist.  Eyes: Conjunctivae and EOM are normal. Pupils are equal, round, and reactive to light.  Neck: Normal range of motion. Neck supple.  Cardiovascular: Normal rate, regular rhythm and normal heart sounds.   Pulmonary/Chest: Effort normal and breath sounds normal. No respiratory distress. She has no wheezes.  Pain reproducible with palpation of left anterior chest wall; no bony deformities or crepitus  Abdominal: Soft. Bowel sounds are normal. There is no tenderness. There is no guarding.   Musculoskeletal: Normal range of motion. She exhibits no edema.  Neurological: She is alert and oriented to person, place, and time.  Skin: Skin is warm and dry. She is not diaphoretic.  Psychiatric: She has a normal mood and affect.  Nursing note and vitals reviewed.   ED Course  Procedures (including critical care time) Labs Review Labs Reviewed  CBC - Abnormal; Notable for the following:    Hemoglobin 9.8 (*)    HCT 31.4 (*)    MCV 77.9 (*)    MCH 24.3 (*)    RDW 18.3 (*)    Platelets 403 (*)    All other components within normal limits  PROTIME-INR - Abnormal; Notable for the following:    Prothrombin Time 25.2 (*)    INR 2.32 (*)    All other components within normal limits  COMPREHENSIVE METABOLIC PANEL - Abnormal; Notable for the following:    CO2 21 (*)    Glucose, Bld 101 (*)    BUN 5 (*)    Calcium 8.5 (*)    Albumin 3.4 (*)    ALT 11 (*)    All other components within normal limits  I-STAT TROPOININ, ED  Randolm Idol, ED    Imaging Review Dg Chest 2 View  07/19/2014   CLINICAL DATA:  Chest pain  EXAM: CHEST  2 VIEW  COMPARISON:  October 15, 2012  FINDINGS: Lungs are clear. Heart size and pulmonary vascularity are normal. No adenopathy. Patient is status post aortic valve replacement. Incidental note is made of an azygos lobe on the right, an anatomic variant. No bone lesions.  IMPRESSION: No edema or consolidation.   Electronically Signed   By: Lowella Grip III M.D.   On: 07/19/2014 08:48     EKG Interpretation   Date/Time:  Friday Jul 19 2014 07:51:20 EDT Ventricular Rate:  54 PR Interval:  206 QRS Duration: 84 QT Interval:  416 QTC Calculation: 394 R Axis:   40 Text Interpretation:  Sinus rhythm Borderline prolonged PR interval  Anteroseptal infarct, age indeterminate septal T wave inversions Confirmed  by RANCOUR  MD, STEPHEN [phone]) on 07/19/2014 8:13:46 AM      MDM   Final diagnoses:  Chest pain   This is a 49 year old female here  with chest pain that woke her from sleep. Patient reports associated shortness of breath. EKG with septal T wave inversions, seen on prior EKGs. Labwork is reassuring, troponin 2 is negative. Chest x-ray is clear. Patient continues having very mild chest pain here in the ED, which she describes as a dull ache.  On review of medical records and notes from visits with cardiologist, patient has somewhat chronic chest pain which reproducible with palpation.  This seems largely unchanged over the past year or so.  Given ongoing symptoms and negative work-up here, low suspicion for ACS, PE, dissection, or other acute cardiac event at this time.  Patient will be d/c home with cardiology  follow-up.  Discussed plan with patient, he/she acknowledged understanding and agreed with plan of care.  Return precautions given for new or worsening symptoms.  Case discussed with attending physician, Dr. Wyvonnia Dusky, who evaluated patient and agrees with assessment and plan of care.  Larene Pickett, PA-C 07/19/14 1453  Ezequiel Essex, MD 07/19/14 Einar Crow

## 2014-08-02 ENCOUNTER — Ambulatory Visit (INDEPENDENT_AMBULATORY_CARE_PROVIDER_SITE_OTHER): Payer: Commercial Managed Care - HMO | Admitting: Pharmacist Clinician (PhC)/ Clinical Pharmacy Specialist

## 2014-08-02 DIAGNOSIS — Z7901 Long term (current) use of anticoagulants: Secondary | ICD-10-CM

## 2014-08-02 DIAGNOSIS — Z954 Presence of other heart-valve replacement: Secondary | ICD-10-CM | POA: Diagnosis not present

## 2014-08-02 DIAGNOSIS — Z952 Presence of prosthetic heart valve: Secondary | ICD-10-CM

## 2014-08-02 LAB — POCT INR: INR: 2.2

## 2014-08-16 ENCOUNTER — Ambulatory Visit (INDEPENDENT_AMBULATORY_CARE_PROVIDER_SITE_OTHER): Payer: Commercial Managed Care - HMO | Admitting: Pharmacist

## 2014-08-16 DIAGNOSIS — Z954 Presence of other heart-valve replacement: Secondary | ICD-10-CM | POA: Diagnosis not present

## 2014-08-16 DIAGNOSIS — Z7901 Long term (current) use of anticoagulants: Secondary | ICD-10-CM

## 2014-08-16 DIAGNOSIS — Z952 Presence of prosthetic heart valve: Secondary | ICD-10-CM

## 2014-08-16 LAB — POCT INR: INR: 3.2

## 2014-09-03 ENCOUNTER — Other Ambulatory Visit (HOSPITAL_COMMUNITY): Payer: Self-pay | Admitting: Internal Medicine

## 2014-09-03 DIAGNOSIS — Z1231 Encounter for screening mammogram for malignant neoplasm of breast: Secondary | ICD-10-CM

## 2014-09-06 ENCOUNTER — Ambulatory Visit (INDEPENDENT_AMBULATORY_CARE_PROVIDER_SITE_OTHER): Payer: Commercial Managed Care - HMO | Admitting: Pharmacist Clinician (PhC)/ Clinical Pharmacy Specialist

## 2014-09-06 DIAGNOSIS — Z7901 Long term (current) use of anticoagulants: Secondary | ICD-10-CM

## 2014-09-06 DIAGNOSIS — Z954 Presence of other heart-valve replacement: Secondary | ICD-10-CM | POA: Diagnosis not present

## 2014-09-06 DIAGNOSIS — Z952 Presence of prosthetic heart valve: Secondary | ICD-10-CM

## 2014-09-06 LAB — POCT INR: INR: 3.3

## 2014-09-12 ENCOUNTER — Ambulatory Visit (HOSPITAL_COMMUNITY): Payer: Medicaid Other | Attending: Internal Medicine

## 2014-10-04 ENCOUNTER — Ambulatory Visit (INDEPENDENT_AMBULATORY_CARE_PROVIDER_SITE_OTHER): Payer: Commercial Managed Care - HMO | Admitting: Pharmacist Clinician (PhC)/ Clinical Pharmacy Specialist

## 2014-10-04 DIAGNOSIS — Z7901 Long term (current) use of anticoagulants: Secondary | ICD-10-CM

## 2014-10-04 DIAGNOSIS — Z952 Presence of prosthetic heart valve: Secondary | ICD-10-CM

## 2014-10-04 DIAGNOSIS — Z954 Presence of other heart-valve replacement: Secondary | ICD-10-CM

## 2014-10-04 LAB — POCT INR: INR: 3.7

## 2014-10-17 ENCOUNTER — Telehealth: Payer: Self-pay | Admitting: Cardiology

## 2014-10-17 NOTE — Telephone Encounter (Signed)
She says it is urgent that she get the paper work  back this week if possible please. If not by Friday,please by Monday. Please fax [phone].

## 2014-10-25 ENCOUNTER — Ambulatory Visit: Payer: Commercial Managed Care - HMO | Admitting: Pharmacist Clinician (PhC)/ Clinical Pharmacy Specialist

## 2014-10-25 NOTE — Telephone Encounter (Signed)
Tonya Mcintosh is calling back in to see if the pt's functional capacity paperwork has been completed by the doctor. Please call and f/u with her  Thanks

## 2014-10-29 NOTE — Telephone Encounter (Signed)
Spoke with Tonya Mcintosh ,  Received form will have Dr Ellyn Hack to fill out and return

## 2014-10-29 NOTE — Telephone Encounter (Signed)
Tonya Mcintosh is calling back in to see the Functional Capacity form . Pleaser f/u with her  Thanks

## 2014-11-05 NOTE — Telephone Encounter (Signed)
FAXED FORM TO ANNA 11/04/14

## 2014-11-06 ENCOUNTER — Ambulatory Visit (INDEPENDENT_AMBULATORY_CARE_PROVIDER_SITE_OTHER): Payer: Commercial Managed Care - HMO | Admitting: Pharmacist Clinician (PhC)/ Clinical Pharmacy Specialist

## 2014-11-06 DIAGNOSIS — Z952 Presence of prosthetic heart valve: Secondary | ICD-10-CM

## 2014-11-06 DIAGNOSIS — Z7901 Long term (current) use of anticoagulants: Secondary | ICD-10-CM

## 2014-11-06 DIAGNOSIS — Z954 Presence of other heart-valve replacement: Secondary | ICD-10-CM

## 2014-11-06 LAB — POCT INR: INR: 2.9

## 2014-12-04 ENCOUNTER — Ambulatory Visit: Payer: Commercial Managed Care - HMO | Admitting: Pharmacist Clinician (PhC)/ Clinical Pharmacy Specialist

## 2014-12-11 ENCOUNTER — Ambulatory Visit (INDEPENDENT_AMBULATORY_CARE_PROVIDER_SITE_OTHER): Payer: Commercial Managed Care - HMO | Admitting: Pharmacist Clinician (PhC)/ Clinical Pharmacy Specialist

## 2014-12-11 DIAGNOSIS — Z954 Presence of other heart-valve replacement: Secondary | ICD-10-CM

## 2014-12-11 DIAGNOSIS — Z952 Presence of prosthetic heart valve: Secondary | ICD-10-CM

## 2014-12-11 DIAGNOSIS — Z7901 Long term (current) use of anticoagulants: Secondary | ICD-10-CM

## 2014-12-11 LAB — POCT INR: INR: 2.2

## 2014-12-25 ENCOUNTER — Ambulatory Visit: Payer: Commercial Managed Care - HMO | Admitting: Pharmacist Clinician (PhC)/ Clinical Pharmacy Specialist

## 2015-01-01 ENCOUNTER — Telehealth: Payer: Self-pay | Admitting: Cardiovascular Disease

## 2015-01-01 ENCOUNTER — Ambulatory Visit (INDEPENDENT_AMBULATORY_CARE_PROVIDER_SITE_OTHER): Payer: Commercial Managed Care - HMO | Admitting: Pharmacist Clinician (PhC)/ Clinical Pharmacy Specialist

## 2015-01-01 DIAGNOSIS — Z7901 Long term (current) use of anticoagulants: Secondary | ICD-10-CM

## 2015-01-01 DIAGNOSIS — Z952 Presence of prosthetic heart valve: Secondary | ICD-10-CM

## 2015-01-01 DIAGNOSIS — Z954 Presence of other heart-valve replacement: Secondary | ICD-10-CM | POA: Diagnosis not present

## 2015-01-01 LAB — POCT INR: INR: 3.2

## 2015-01-29 ENCOUNTER — Ambulatory Visit (HOSPITAL_COMMUNITY): Payer: Commercial Managed Care - HMO | Attending: Cardiovascular Disease

## 2015-01-29 ENCOUNTER — Ambulatory Visit: Payer: Commercial Managed Care - HMO | Admitting: Pharmacist Clinician (PhC)/ Clinical Pharmacy Specialist

## 2015-01-29 ENCOUNTER — Other Ambulatory Visit: Payer: Self-pay

## 2015-01-29 DIAGNOSIS — E785 Hyperlipidemia, unspecified: Secondary | ICD-10-CM | POA: Diagnosis not present

## 2015-01-29 DIAGNOSIS — I359 Nonrheumatic aortic valve disorder, unspecified: Secondary | ICD-10-CM | POA: Diagnosis present

## 2015-01-29 DIAGNOSIS — Z954 Presence of other heart-valve replacement: Secondary | ICD-10-CM | POA: Diagnosis not present

## 2015-01-29 DIAGNOSIS — I5032 Chronic diastolic (congestive) heart failure: Secondary | ICD-10-CM | POA: Diagnosis not present

## 2015-01-29 DIAGNOSIS — I517 Cardiomegaly: Secondary | ICD-10-CM | POA: Diagnosis not present

## 2015-01-29 DIAGNOSIS — I351 Nonrheumatic aortic (valve) insufficiency: Secondary | ICD-10-CM | POA: Diagnosis not present

## 2015-01-29 DIAGNOSIS — Z952 Presence of prosthetic heart valve: Secondary | ICD-10-CM

## 2015-01-30 ENCOUNTER — Telehealth: Payer: Self-pay | Admitting: *Deleted

## 2015-01-30 NOTE — Telephone Encounter (Signed)
-----   Message from Leonie Man, MD sent at 01/29/2015 11:26 PM EST ----- Overall stable findings on Echo - aortic Valve looks pretty much the same as last year.  Pump function is normal.  HARDING, Leonie Green, MD

## 2015-01-30 NOTE — Telephone Encounter (Signed)
Spoke to patient. Result given . Verbalized understanding Patient wanted to schedule protime  ----Schedule 02/05/15

## 2015-02-05 ENCOUNTER — Ambulatory Visit: Payer: Commercial Managed Care - HMO | Admitting: Pharmacist Clinician (PhC)/ Clinical Pharmacy Specialist

## 2015-02-06 ENCOUNTER — Ambulatory Visit (INDEPENDENT_AMBULATORY_CARE_PROVIDER_SITE_OTHER): Payer: Commercial Managed Care - HMO | Admitting: Pharmacist Clinician (PhC)/ Clinical Pharmacy Specialist

## 2015-02-06 DIAGNOSIS — Z7901 Long term (current) use of anticoagulants: Secondary | ICD-10-CM

## 2015-02-06 DIAGNOSIS — Z952 Presence of prosthetic heart valve: Secondary | ICD-10-CM

## 2015-02-06 DIAGNOSIS — Z954 Presence of other heart-valve replacement: Secondary | ICD-10-CM | POA: Diagnosis not present

## 2015-02-06 LAB — POCT INR: INR: 2.3

## 2015-03-05 ENCOUNTER — Ambulatory Visit: Payer: Commercial Managed Care - HMO | Admitting: Pharmacist Clinician (PhC)/ Clinical Pharmacy Specialist

## 2015-03-07 ENCOUNTER — Ambulatory Visit (INDEPENDENT_AMBULATORY_CARE_PROVIDER_SITE_OTHER): Payer: Commercial Managed Care - HMO | Admitting: Pharmacist Clinician (PhC)/ Clinical Pharmacy Specialist

## 2015-03-07 DIAGNOSIS — Z954 Presence of other heart-valve replacement: Secondary | ICD-10-CM | POA: Diagnosis not present

## 2015-03-07 DIAGNOSIS — Z7901 Long term (current) use of anticoagulants: Secondary | ICD-10-CM

## 2015-03-07 DIAGNOSIS — Z952 Presence of prosthetic heart valve: Secondary | ICD-10-CM

## 2015-03-07 LAB — POCT INR: INR: 4.2

## 2015-03-28 ENCOUNTER — Ambulatory Visit (INDEPENDENT_AMBULATORY_CARE_PROVIDER_SITE_OTHER): Payer: Commercial Managed Care - HMO | Admitting: Pharmacist Clinician (PhC)/ Clinical Pharmacy Specialist

## 2015-03-28 DIAGNOSIS — Z954 Presence of other heart-valve replacement: Secondary | ICD-10-CM

## 2015-03-28 DIAGNOSIS — Z7901 Long term (current) use of anticoagulants: Secondary | ICD-10-CM | POA: Diagnosis not present

## 2015-03-28 DIAGNOSIS — Z952 Presence of prosthetic heart valve: Secondary | ICD-10-CM

## 2015-03-28 LAB — POCT INR: INR: 4.4

## 2015-04-04 ENCOUNTER — Other Ambulatory Visit: Payer: Self-pay | Admitting: Pharmacist Clinician (PhC)/ Clinical Pharmacy Specialist

## 2015-04-04 MED ORDER — WARFARIN SODIUM 3 MG PO TABS: ORAL_TABLET | ORAL | Status: DC

## 2015-04-11 ENCOUNTER — Encounter: Payer: Commercial Managed Care - HMO | Admitting: Pharmacist Clinician (PhC)/ Clinical Pharmacy Specialist

## 2015-04-16 ENCOUNTER — Ambulatory Visit (INDEPENDENT_AMBULATORY_CARE_PROVIDER_SITE_OTHER): Payer: Commercial Managed Care - HMO | Admitting: Pharmacist Clinician (PhC)/ Clinical Pharmacy Specialist

## 2015-04-16 DIAGNOSIS — Z952 Presence of prosthetic heart valve: Secondary | ICD-10-CM

## 2015-04-16 DIAGNOSIS — Z7901 Long term (current) use of anticoagulants: Secondary | ICD-10-CM

## 2015-04-16 DIAGNOSIS — Z954 Presence of other heart-valve replacement: Secondary | ICD-10-CM | POA: Diagnosis not present

## 2015-04-16 LAB — POCT INR: INR: 2.8

## 2015-05-13 ENCOUNTER — Encounter: Payer: Commercial Managed Care - HMO | Admitting: Pharmacist Clinician (PhC)/ Clinical Pharmacy Specialist

## 2015-05-22 ENCOUNTER — Ambulatory Visit (INDEPENDENT_AMBULATORY_CARE_PROVIDER_SITE_OTHER): Payer: Commercial Managed Care - HMO | Admitting: Pharmacist Clinician (PhC)/ Clinical Pharmacy Specialist

## 2015-05-22 DIAGNOSIS — Z7901 Long term (current) use of anticoagulants: Secondary | ICD-10-CM | POA: Diagnosis not present

## 2015-05-22 DIAGNOSIS — Z954 Presence of other heart-valve replacement: Secondary | ICD-10-CM | POA: Diagnosis not present

## 2015-05-22 DIAGNOSIS — Z952 Presence of prosthetic heart valve: Secondary | ICD-10-CM

## 2015-05-22 LAB — POCT INR: INR: 2.3

## 2015-05-23 ENCOUNTER — Encounter: Payer: Commercial Managed Care - HMO | Admitting: Pharmacist Clinician (PhC)/ Clinical Pharmacy Specialist

## 2015-06-05 ENCOUNTER — Encounter: Payer: Commercial Managed Care - HMO | Admitting: Pharmacist Clinician (PhC)/ Clinical Pharmacy Specialist

## 2015-06-06 ENCOUNTER — Ambulatory Visit (INDEPENDENT_AMBULATORY_CARE_PROVIDER_SITE_OTHER): Payer: Commercial Managed Care - HMO | Admitting: Pharmacist Clinician (PhC)/ Clinical Pharmacy Specialist

## 2015-06-06 DIAGNOSIS — Z7901 Long term (current) use of anticoagulants: Secondary | ICD-10-CM | POA: Diagnosis not present

## 2015-06-06 DIAGNOSIS — Z952 Presence of prosthetic heart valve: Secondary | ICD-10-CM

## 2015-06-06 DIAGNOSIS — Z954 Presence of other heart-valve replacement: Secondary | ICD-10-CM

## 2015-06-06 LAB — POCT INR: INR: 2.1

## 2015-06-18 ENCOUNTER — Other Ambulatory Visit (HOSPITAL_COMMUNITY): Payer: Self-pay | Admitting: Internal Medicine

## 2015-06-18 DIAGNOSIS — Z1231 Encounter for screening mammogram for malignant neoplasm of breast: Secondary | ICD-10-CM

## 2015-06-20 ENCOUNTER — Encounter: Payer: Commercial Managed Care - HMO | Admitting: Pharmacist Clinician (PhC)/ Clinical Pharmacy Specialist

## 2015-06-23 ENCOUNTER — Ambulatory Visit (INDEPENDENT_AMBULATORY_CARE_PROVIDER_SITE_OTHER): Payer: Commercial Managed Care - HMO | Admitting: Pharmacist Clinician (PhC)/ Clinical Pharmacy Specialist

## 2015-06-23 DIAGNOSIS — Z7901 Long term (current) use of anticoagulants: Secondary | ICD-10-CM

## 2015-06-23 DIAGNOSIS — Z954 Presence of other heart-valve replacement: Secondary | ICD-10-CM

## 2015-06-23 DIAGNOSIS — Z952 Presence of prosthetic heart valve: Secondary | ICD-10-CM

## 2015-06-23 LAB — POCT INR: INR: 3.5

## 2015-07-02 ENCOUNTER — Ambulatory Visit: Payer: Commercial Managed Care - HMO

## 2015-07-09 ENCOUNTER — Emergency Department (EMERGENCY_DEPARTMENT_HOSPITAL)
Admit: 2015-07-09 | Discharge: 2015-07-09 | Disposition: A | Discharge status: Home / Self Care | Payer: Commercial Managed Care - HMO | Attending: Emergency Medicine | Admitting: Emergency Medicine

## 2015-07-09 ENCOUNTER — Emergency Department (HOSPITAL_COMMUNITY)
Admission: EM | Admit: 2015-07-09 | Discharge: 2015-07-09 | Disposition: A | Discharge status: Home / Self Care | Payer: Commercial Managed Care - HMO | Attending: Emergency Medicine | Admitting: Emergency Medicine

## 2015-07-09 ENCOUNTER — Emergency Department (HOSPITAL_COMMUNITY): Payer: Commercial Managed Care - HMO

## 2015-07-09 ENCOUNTER — Encounter (HOSPITAL_COMMUNITY): Payer: Self-pay | Admitting: Emergency Medicine

## 2015-07-09 DIAGNOSIS — R079 Chest pain, unspecified: Secondary | ICD-10-CM | POA: Diagnosis present

## 2015-07-09 DIAGNOSIS — R05 Cough: Secondary | ICD-10-CM | POA: Diagnosis not present

## 2015-07-09 DIAGNOSIS — R0789 Other chest pain: Secondary | ICD-10-CM | POA: Diagnosis not present

## 2015-07-09 DIAGNOSIS — M79606 Pain in leg, unspecified: Secondary | ICD-10-CM

## 2015-07-09 DIAGNOSIS — Z7901 Long term (current) use of anticoagulants: Secondary | ICD-10-CM | POA: Insufficient documentation

## 2015-07-09 DIAGNOSIS — M79604 Pain in right leg: Secondary | ICD-10-CM | POA: Diagnosis not present

## 2015-07-09 DIAGNOSIS — R059 Cough, unspecified: Secondary | ICD-10-CM

## 2015-07-09 DIAGNOSIS — E785 Hyperlipidemia, unspecified: Secondary | ICD-10-CM | POA: Diagnosis not present

## 2015-07-09 LAB — PROTIME-INR
INR: 3.33 — ABNORMAL HIGH (ref 0.00–1.49)
Prothrombin Time: 33.1 seconds — ABNORMAL HIGH (ref 11.6–15.2)

## 2015-07-09 LAB — BASIC METABOLIC PANEL
Anion gap: 5 (ref 5–15)
BUN: 9 mg/dL (ref 6–20)
CHLORIDE: 109 mmol/L (ref 101–111)
CO2: 22 mmol/L (ref 22–32)
CREATININE: 0.89 mg/dL (ref 0.44–1.00)
Calcium: 8.5 mg/dL — ABNORMAL LOW (ref 8.9–10.3)
GFR calc Af Amer: >60 mL/min (ref >60)
GFR calc non Af Amer: >60 mL/min (ref >60)
GLUCOSE: 94 mg/dL (ref 65–99)
POTASSIUM: 3.8 mmol/L (ref 3.5–5.1)
SODIUM: 136 mmol/L (ref 135–145)

## 2015-07-09 LAB — CBC
HEMATOCRIT: 29 % — AB (ref 36.0–46.0)
Hemoglobin: 9 g/dL — ABNORMAL LOW (ref 12.0–15.0)
MCH: 23.5 pg — AB (ref 26.0–34.0)
MCHC: 31 g/dL (ref 30.0–36.0)
MCV: 75.7 fL — AB (ref 78.0–100.0)
PLATELETS: 368 10*3/uL (ref 150–400)
RBC: 3.83 MIL/uL — ABNORMAL LOW (ref 3.87–5.11)
RDW: 15.8 % — AB (ref 11.5–15.5)
WBC: 4.2 10*3/uL (ref 4.0–10.5)

## 2015-07-09 LAB — I-STAT TROPONIN, ED
Troponin i, poc: 0 ng/mL (ref 0.00–0.08)
Troponin i, poc: 0 ng/mL (ref 0.00–0.08)

## 2015-07-09 NOTE — Discharge Instructions (Signed)
There does not appear to be an emergent cause for your symptoms at this time. Your exam, labs, EKG and imaging were all reassuring. Follow-up with your cardiologist, Dr. Ellyn Hack in the next couple of days for reevaluation. Return to ED for new or worsening symptoms as we discussed.

## 2015-07-09 NOTE — ED Provider Notes (Signed)
CSN: [id number]     Arrival date & time 07/09/15  0941 History   First MD Initiated Contact with Patient 07/09/15 1035     Chief Complaint  Patient presents with  . Chest Pain     (Consider location/radiation/quality/duration/timing/severity/associated sxs/prior Treatment) HPI Tonya Mcintosh is a 50 y.o. 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 Patient reports she has had intermittent back pain across her shoulders for the past 2 or 3 days-Worse with movement. She reports waking up this morning at 7 AM and felt a pain in her throat, painful to swallow. She reports an approximate 7:30 AM she began to develop a sharp chest pain that is still going on now. Reports that her chest only hurts when she coughs. She also reports that she has had a dry cough over the past one week. She denies any fevers, chills, nausea or vomiting, diaphoresis, numbness or weakness, shortness of breath. States her cardiologist is Dr. Ellyn Hack  Past Medical History  Diagnosis Date  . Aortic valve replaced 1987; September 2010  . Abnormal vaginal Pap smear     CIN I  . Hypercholesterolemia   . VSD (ventricular septal defect) 1979    repair  . Hyperlipidemia   . Kidney calculi   . Kidney infection   . 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  . H/O insomnia    Past Surgical History  Procedure Laterality Date  . Aortic valve replacement  07/17/1985    23-mm Medtronic Hall prosthesis  . Vsd repair  02-22-77  . Cardiac catheterization  01/25/1985    Preop 10 2010, no significant CAD.  Marland Kitchen Cardiovascular stress test  09/22/2008    Fixed defect at the apex. No stress induced ischemia.  . 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 Jude tactile prosthesis (bileaflet)  . Transthoracic echocardiogram  12/25/2008    EF  >55%, doppler evidence of regurg is probably normal of St Jude prosthetic aortic valve.  . Transthoracic echocardiogram  03/19/2013    EF 60-65%; ? Grade 3 diastolic dysfunction; Mech Prosthesis of AoV -mild-mod AI. Mildly increased gradient.   No family history on file. Social History  Substance Use Topics  . Smoking status: Never Smoker   . Smokeless tobacco: None  . Alcohol Use: No   OB History    No data available     Review of Systems A 10 point review of systems was completed and was negative except for pertinent positives and negatives as mentioned in the history of present illness     Allergies  Codeine  Home Medications   Prior to Admission medications   Medication Sig Start Date End Date Taking? Authorizing Provider  acetaminophen (TYLENOL) 500 MG tablet Take 1 tablet (500 mg total) by mouth every 6 (six) hours as needed for pain. Patient taking differently: Take 1,000 mg by mouth every 6 (six) hours as needed for pain.  10/15/12  Yes Domenic Moras, PA-C  warfarin (COUMADIN) 3 MG tablet Take 1 to 1.5 tablets by mouth daily as directed by coumadin clinic Patient taking differently: Take 3-4.5 mg by mouth daily. Take 1 tablet everyday except for Monday and Friday she takes 1.5 tablets. @1600  04/04/15  Yes Leonie Man, MD   BP 105/69 mmHg  Pulse 60  Temp(Src) 98.3 F (36.8  C) (Oral)  Resp 17  SpO2 98%  LMP 06/07/2015 Physical Exam  Constitutional: She is oriented to person, place, and time. She appears well-developed and well-nourished.  HENT:  Head: Normocephalic and atraumatic.  Mouth/Throat: Oropharynx is clear and moist.  Eyes: Conjunctivae are normal. Pupils are equal, round, and reactive to light. Right eye exhibits no discharge. Left eye exhibits no discharge. No scleral icterus.  Neck: Neck supple.  Cardiovascular: Normal rate and regular rhythm.   Blowing S1, S2 click  Pulmonary/Chest: Effort normal and breath sounds normal. No respiratory distress. She  has no wheezes. She has no rales.  Abdominal: Soft. There is no tenderness.  Musculoskeletal: She exhibits no edema.  Reports diffuse pain in right popliteal space. No erythema, edema. No appreciable unilateral leg swelling. Full active range of motion.  Neurological: She is alert and oriented to person, place, and time.  Cranial Nerves II-XII grossly intact  Skin: Skin is warm and dry. No rash noted.  Psychiatric: She has a normal mood and affect.  Nursing note and vitals reviewed.   ED Course  Procedures (including critical care time) Labs Review Labs Reviewed  BASIC METABOLIC PANEL - Abnormal; Notable for the following:    Calcium 8.5 (*)    All other components within normal limits  CBC - Abnormal; Notable for the following:    RBC 3.83 (*)    Hemoglobin 9.0 (*)    HCT 29.0 (*)    MCV 75.7 (*)    MCH 23.5 (*)    RDW 15.8 (*)    All other components within normal limits  PROTIME-INR - Abnormal; Notable for the following:    Prothrombin Time 33.1 (*)    INR 3.33 (*)    All other components within normal limits  I-STAT TROPOININ, ED  Randolm Idol, ED    Imaging Review Dg Chest 2 View  07/09/2015  CLINICAL DATA:  Chest pain EXAM: CHEST  2 VIEW COMPARISON:  07/19/2014 FINDINGS: Chronic left ventricular enlargement. Status post aortic valve replacement. Stable aortic and hilar contours. There is no edema, consolidation, effusion, or pneumothorax. No acute osseous finding. IMPRESSION: Stable exam.  No evidence of acute disease. Electronically Signed   By: Monte Fantasia M.D.   On: 07/09/2015 11:29   I have personally reviewed and evaluated these images and lab results as part of my medical decision-making.   EKG Interpretation   Date/Time:  Wednesday Jul 09 2015 10:10:46 EDT Ventricular Rate:  50 PR Interval:  218 QRS Duration: 86 QT Interval:  430 QTC Calculation: 392 R Axis:   94 Text Interpretation:  Sinus rhythm Prolonged PR interval Borderline right  axis  deviation Baseline wander in lead(s) V5 No significant change since  last tracing Confirmed by NGUYEN, EMILY ([id number]) on 07/09/2015 2:12:11 PM     Filed Vitals:   07/09/15 1009 07/09/15 1329  BP: 125/71 105/69  Pulse: 64 60  Temp: 98.3 F (36.8 C)   TempSrc: Oral   Resp: 18 17  SpO2: 99% 98%    MDM  MARGARETHE SCHOENBERGER is a 50 y.o. female with history of mechanical aortic valve replacement on Coumadin therapy here for evaluation of sore throat, chest pain only with cough, back pain and leg pain. Symptoms sound atypical for emergent pathology. However, with patient's history, we will obtain screening labs, troponin, EKG and chest x-ray. She reports she has had a blood clot in the past and right leg pain is similar, but upon review of old notes, I  do not see any evidence of this. Obtain ultrasound to rule out DVT. Upon reevaluation, patient is sleeping comfortably. Ultrasound is negative for any DVT, screening labs are grossly unremarkable and appear baseline for patient. Initial troponin is 0.00. Plan to obtain delta troponin. If negative, anticipate discharge for patient to follow-up with her cardiologist. Delta troponin negative. Patient overall appears well, nontoxic, hemodynamically stable and appropriate for outpatient follow-up. Prior to patient discharge, I discussed and reviewed this case with Dr. Alfonse Spruce who agrees to plan   Final diagnoses:  Leg pain  Cough  Chest discomfort       Comer Locket, PA-C 07/09/15 [place], MD 07/20/15 2030

## 2015-07-09 NOTE — Progress Notes (Signed)
VASCULAR LAB PRELIMINARY  PRELIMINARY  PRELIMINARY  PRELIMINARY  Right lower extremity venous duplex completed.    Preliminary report:  Right:  No evidence of DVT, superficial thrombosis, or Baker's cyst. Sluggish flow noted throughput the thigh.  Rizwan Kuyper, [place], RVS 07/09/2015, 12:27 PM

## 2015-07-09 NOTE — ED Notes (Signed)
Patient here with complaints of chest pain that radiates up into throat, started yesterday around 7am. On blood thinners. Denies n/v.

## 2015-07-14 ENCOUNTER — Encounter: Payer: Commercial Managed Care - HMO | Admitting: Pharmacist Clinician (PhC)/ Clinical Pharmacy Specialist

## 2015-07-15 ENCOUNTER — Ambulatory Visit
Admission: RE | Admit: 2015-07-15 | Discharge: 2015-07-15 | Disposition: A | Discharge status: Home / Self Care | Payer: Commercial Managed Care - HMO | Source: Ambulatory Visit | Attending: Internal Medicine | Admitting: Internal Medicine

## 2015-07-15 DIAGNOSIS — Z1231 Encounter for screening mammogram for malignant neoplasm of breast: Secondary | ICD-10-CM

## 2015-07-23 ENCOUNTER — Ambulatory Visit (INDEPENDENT_AMBULATORY_CARE_PROVIDER_SITE_OTHER): Payer: Commercial Managed Care - HMO | Admitting: Pharmacist

## 2015-07-23 DIAGNOSIS — Z7901 Long term (current) use of anticoagulants: Secondary | ICD-10-CM | POA: Diagnosis not present

## 2015-07-23 DIAGNOSIS — Z954 Presence of other heart-valve replacement: Secondary | ICD-10-CM

## 2015-07-23 DIAGNOSIS — Z952 Presence of prosthetic heart valve: Secondary | ICD-10-CM

## 2015-07-23 LAB — POCT INR: INR: 3.6

## 2015-08-13 ENCOUNTER — Other Ambulatory Visit: Payer: Self-pay | Admitting: Cardiology

## 2015-08-13 ENCOUNTER — Ambulatory Visit (INDEPENDENT_AMBULATORY_CARE_PROVIDER_SITE_OTHER): Payer: Commercial Managed Care - HMO | Admitting: Pharmacist Clinician (PhC)/ Clinical Pharmacy Specialist

## 2015-08-13 DIAGNOSIS — Z952 Presence of prosthetic heart valve: Secondary | ICD-10-CM

## 2015-08-13 DIAGNOSIS — Z954 Presence of other heart-valve replacement: Secondary | ICD-10-CM

## 2015-08-13 DIAGNOSIS — Z7901 Long term (current) use of anticoagulants: Secondary | ICD-10-CM | POA: Diagnosis not present

## 2015-08-13 LAB — POCT INR: INR: 2.8

## 2015-08-31 NOTE — Telephone Encounter (Signed)
Note in error.

## 2015-09-11 ENCOUNTER — Ambulatory Visit (INDEPENDENT_AMBULATORY_CARE_PROVIDER_SITE_OTHER): Payer: Commercial Managed Care - HMO | Admitting: Pharmacist Clinician (PhC)/ Clinical Pharmacy Specialist

## 2015-09-11 DIAGNOSIS — Z7901 Long term (current) use of anticoagulants: Secondary | ICD-10-CM | POA: Diagnosis not present

## 2015-09-11 DIAGNOSIS — Z954 Presence of other heart-valve replacement: Secondary | ICD-10-CM | POA: Diagnosis not present

## 2015-09-11 DIAGNOSIS — Z952 Presence of prosthetic heart valve: Secondary | ICD-10-CM

## 2015-09-11 LAB — POCT INR: INR: 5.3

## 2015-10-01 ENCOUNTER — Encounter (INDEPENDENT_AMBULATORY_CARE_PROVIDER_SITE_OTHER): Payer: Self-pay

## 2015-10-01 ENCOUNTER — Telehealth: Payer: Self-pay | Admitting: Cardiology

## 2015-10-01 ENCOUNTER — Ambulatory Visit (INDEPENDENT_AMBULATORY_CARE_PROVIDER_SITE_OTHER): Payer: Commercial Managed Care - HMO | Admitting: Pharmacist

## 2015-10-01 DIAGNOSIS — Z954 Presence of other heart-valve replacement: Secondary | ICD-10-CM

## 2015-10-01 DIAGNOSIS — Z7901 Long term (current) use of anticoagulants: Secondary | ICD-10-CM

## 2015-10-01 DIAGNOSIS — Z952 Presence of prosthetic heart valve: Secondary | ICD-10-CM

## 2015-10-01 LAB — PROTIME-INR
INR: 5.7 — ABNORMAL HIGH
PROTHROMBIN TIME: 55.7 s — AB (ref 9.0–11.5)

## 2015-10-01 LAB — POCT INR: INR: 6.5

## 2015-10-01 NOTE — Telephone Encounter (Signed)
New message        Calling to give abnormal labs to a nurse

## 2015-10-01 NOTE — Telephone Encounter (Signed)
See anticoag visit from 10/01/15 for dosing.

## 2015-10-01 NOTE — Telephone Encounter (Signed)
Returned call to [place] at [facility].  Received abnormal PT of 55.7 and INR of 5.7. Routing to pharmacy.

## 2015-11-03 ENCOUNTER — Encounter: Payer: Self-pay | Admitting: *Deleted

## 2015-11-03 ENCOUNTER — Ambulatory Visit: Payer: Commercial Managed Care - HMO | Admitting: Cardiology

## 2015-11-03 NOTE — Progress Notes (Deleted)
PCP: Benito Mccreedy, MD  Clinic Note: No chief complaint on file.   HPI: Tonya Mcintosh is a 50 y.o. female with a PMH below who presents today for delayed annual follow-up status post aortic valve replacement.. She is a very pleasant woman who has a history of congenital heart disease VSD and aortic regurgitation initially treated in 1980 with VSD closure and aortic valvuloplasty.  1980: VSD closure and aortic valve repair  AVR 1987 with Medtronic bioprosthesis  2006: Endocarditis treated medically  October 2010: Redo AVR with St. Jude bileaflet valve -- on long-standing warfarin therapy Echocardiogram January 2015: EF 60-65%. Gr 3 DD?, Mechanical prosthetic aortic valve present with mild-moderate regurgitation. Velocity across the valve is slightly higher than expected.  Tonya Mcintosh was last seen in May 2016.  She was complaining of what sound like chest wall pain.  Recent Hospitalizations: ***  Studies Reviewed: ***  Interval History: ***   No chest pain or shortness of breath with rest or exertion. No PND, orthopnea or edema. No palpitations, lightheadedness, dizziness, weakness or syncope/near syncope. No TIA/amaurosis fugax symptoms. No melena, hematochezia, hematuria, or epstaxis. No claudication.  ROS: A comprehensive was performed. ROS   Past Medical History:  Diagnosis Date  . Abnormal vaginal Pap smear    CIN I  . Aortic valve replaced 1987; September 2010  . Endocarditis 2006   Treated w/ antibiotics long term, doing well since.  . H/O insomnia   . Hypercholesterolemia   . Hyperlipidemia   . Kidney calculi   . Kidney infection   . VSD (ventricular septal defect) 1979   repair    Past Surgical History:  Procedure Laterality Date  . AORTIC VALVE REPLACEMENT  07/17/1985   23-mm Medtronic Hall prosthesis  . CARDIAC CATHETERIZATION  01/25/1985   Preop 10 2010, no significant CAD.  Marland Kitchen CARDIOVASCULAR STRESS TEST  09/22/2008   Fixed defect at the  apex. No stress induced ischemia.  Marland Kitchen MECHANICAL AORTIC VALVE REPLACEMENT  October 2010   Medtronic prosthetic valve exchanged for a St. Jude tactile prosthesis (bileaflet)  . TRANSTHORACIC ECHOCARDIOGRAM  12/25/2008   EF >55%, doppler evidence of regurg is probably normal of St Jude prosthetic aortic valve.  . TRANSTHORACIC ECHOCARDIOGRAM  03/19/2013   EF 60-65%; ? Grade 3 diastolic dysfunction; Mech Prosthesis of AoV -mild-mod AI. Mildly increased gradient.  . VSD REPAIR  02-22-77    @hmed @  @all @  Social History   Social History  . Marital status: Married    Spouse name: N/A  . Number of children: N/A  . Years of education: N/A   Social History Main Topics  . Smoking status: Never Smoker  . Smokeless tobacco: Not on file  . Alcohol use No  . Drug use: No  . Sexual activity: Not on file   Other Topics Concern  . Not on file   Social History Narrative   Lives with ex-husband and children in [place]. No smoking, no etoh, disability   No family history on file. ***  Wt Readings from Last 3 Encounters:  06/28/14 128 lb (58.1 kg)  02/01/13 122 lb 14.4 oz (55.7 kg)  12/15/12 126 lb 14.4 oz (57.6 kg)    PHYSICAL EXAM There were no vitals taken for this visit. General appearance: alert, cooperative, appears stated age, no distress and Well-nourished and well-groomed. Pleasant mood and affect. Otherwise healthy-appearing HEENT: /AT, EOMI, MMM, anicteric sclera Neck: no adenopathy, no carotid bruit, no JVD and supple, symmetrical, trachea midline Lungs: clear  to auscultation bilaterally, normal percussion bilaterally and Nonlabored, good air movement Heart: regularly irregular rhythm, S1: normal prosthetic S1, S2: normal;  Soft 1-2/6 SEM at RUSB. No R./G. Nondisplaced PMI. Abdomen: soft, non-tender; bowel sounds normal; no masses,  no organomegaly Extremities: extremities normal, atraumatic, no cyanosis or edema Pulses: 2+ and symmetric Neurologic: Alert and oriented X 3,  normal strength and tone. Normal symmetric reflexes. Normal coordination and gait   Adult ECG Report  Rate: *** ;  Rhythm: {rhythm:17366};   Narrative Interpretation: ***   Other studies Reviewed: Additional studies/ records that were reviewed today include:  Recent Labs:  None available   ASSESSMENT / PLAN: Problem List Items Addressed This Visit    S/P aortic valve replacement - Primary (Chronic)   Long term current use of anticoagulant therapy (Chronic)   Heart palpitations (Chronic)   Dyslipidemia (Chronic)   Discomfort of chest wall (Chronic)   Chronic diastolic heart failure, NYHA class 1 (HCC) (Chronic)    Other Visit Diagnoses   None.     Current medicines are reviewed at length with the patient today. (+/- concerns) *** The following changes have been made: *** Studies Ordered:   No orders of the defined types were placed in this encounter.     Glenetta Hew, M.D., M.S. Interventional Cardiologist   Pager # [phone] Phone # [phone] [address]. [place], [place] [address]

## 2015-11-03 NOTE — Progress Notes (Signed)
Letter mailed

## 2015-11-12 ENCOUNTER — Ambulatory Visit (INDEPENDENT_AMBULATORY_CARE_PROVIDER_SITE_OTHER): Payer: Commercial Managed Care - HMO | Admitting: Pharmacist Clinician (PhC)/ Clinical Pharmacy Specialist

## 2015-11-12 DIAGNOSIS — Z954 Presence of other heart-valve replacement: Secondary | ICD-10-CM | POA: Diagnosis not present

## 2015-11-12 DIAGNOSIS — Z7901 Long term (current) use of anticoagulants: Secondary | ICD-10-CM | POA: Diagnosis not present

## 2015-11-12 DIAGNOSIS — Z952 Presence of prosthetic heart valve: Secondary | ICD-10-CM

## 2015-11-12 LAB — POCT INR: INR: 2.5

## 2015-12-10 ENCOUNTER — Ambulatory Visit: Payer: Commercial Managed Care - HMO | Admitting: Cardiology

## 2015-12-11 ENCOUNTER — Encounter: Payer: Self-pay | Admitting: *Deleted

## 2015-12-15 ENCOUNTER — Ambulatory Visit (INDEPENDENT_AMBULATORY_CARE_PROVIDER_SITE_OTHER): Payer: Commercial Managed Care - HMO | Admitting: Pharmacist

## 2015-12-15 DIAGNOSIS — Z7901 Long term (current) use of anticoagulants: Secondary | ICD-10-CM | POA: Diagnosis not present

## 2015-12-15 DIAGNOSIS — Z952 Presence of prosthetic heart valve: Secondary | ICD-10-CM

## 2015-12-15 LAB — POCT INR: INR: 3.3

## 2015-12-15 MED ORDER — WARFARIN SODIUM 3 MG PO TABS: ORAL_TABLET | ORAL | 3 refills | Status: DC

## 2016-01-28 ENCOUNTER — Ambulatory Visit (INDEPENDENT_AMBULATORY_CARE_PROVIDER_SITE_OTHER): Payer: Commercial Managed Care - HMO | Admitting: Pharmacist Clinician (PhC)/ Clinical Pharmacy Specialist

## 2016-01-28 DIAGNOSIS — Z7901 Long term (current) use of anticoagulants: Secondary | ICD-10-CM

## 2016-01-28 DIAGNOSIS — Z952 Presence of prosthetic heart valve: Secondary | ICD-10-CM

## 2016-01-28 LAB — POCT INR: INR: 3.6

## 2016-03-03 ENCOUNTER — Ambulatory Visit (INDEPENDENT_AMBULATORY_CARE_PROVIDER_SITE_OTHER): Payer: Commercial Managed Care - HMO | Admitting: Pharmacist Clinician (PhC)/ Clinical Pharmacy Specialist

## 2016-03-03 DIAGNOSIS — Z952 Presence of prosthetic heart valve: Secondary | ICD-10-CM | POA: Diagnosis not present

## 2016-03-03 DIAGNOSIS — Z7901 Long term (current) use of anticoagulants: Secondary | ICD-10-CM

## 2016-03-03 LAB — POCT INR: INR: 3.3

## 2016-03-24 ENCOUNTER — Encounter: Payer: Self-pay | Admitting: Internal Medicine

## 2016-03-24 ENCOUNTER — Other Ambulatory Visit (HOSPITAL_COMMUNITY)
Admission: RE | Admit: 2016-03-24 | Discharge: 2016-03-24 | Disposition: A | Discharge status: Home / Self Care | Payer: Commercial Managed Care - HMO | Source: Ambulatory Visit | Attending: Family Medicine | Admitting: Family Medicine

## 2016-03-24 ENCOUNTER — Ambulatory Visit (INDEPENDENT_AMBULATORY_CARE_PROVIDER_SITE_OTHER): Payer: Commercial Managed Care - HMO | Admitting: Internal Medicine

## 2016-03-24 VITALS — BP 122/70 | HR 82 | Temp 98.3°F | Ht 62.0 in | Wt 137.0 lb

## 2016-03-24 DIAGNOSIS — Z1151 Encounter for screening for human papillomavirus (HPV): Secondary | ICD-10-CM | POA: Insufficient documentation

## 2016-03-24 DIAGNOSIS — Z01419 Encounter for gynecological examination (general) (routine) without abnormal findings: Secondary | ICD-10-CM | POA: Diagnosis present

## 2016-03-24 DIAGNOSIS — Z124 Encounter for screening for malignant neoplasm of cervix: Secondary | ICD-10-CM | POA: Diagnosis not present

## 2016-03-24 DIAGNOSIS — N951 Menopausal and female climacteric states: Secondary | ICD-10-CM | POA: Diagnosis not present

## 2016-03-24 DIAGNOSIS — E785 Hyperlipidemia, unspecified: Secondary | ICD-10-CM

## 2016-03-24 MED ORDER — PAROXETINE HCL ER 12.5 MG PO TB24: 12.5000 mg | ORAL_TABLET | Freq: Every day | ORAL | 1 refills | Status: DC

## 2016-03-24 NOTE — Assessment & Plan Note (Signed)
Discussed that patient is not a good candidate for OTC herbal supplements due to potential interaction with coumadin therapy. Offered low dose Paxil for hot flashes. Patient declined and will call if she decides to start treatment.

## 2016-03-24 NOTE — Progress Notes (Signed)
   Subjective:    Tonya Mcintosh - 51 y.o. female MRN [id number]  Date of birth: October 04, 1965  HPI  Tonya Mcintosh is here to establish care. 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 Patient is followed by cardiology for coumadin management.   Hot Flashes: Reports still having menstrual periods although they are becoming more irregular. Having hot flashes throughout the day and night flashes. Has not tried any medications.   Health Maintenance  -reports last mammogram in Feb 2017, no h/o of abnormal mammograms -needs PAP, last PAP in 2011, no h/o abnormal PAP -declines STD testing   Social Hx:  -does not exercise regularly  -never smoker  -no recreational drug or EtOH use  -sexually active with one female partner; BTL for contraception  -PHQ-2 score of 0    -  reports that she has never smoked. She does not have any smokeless tobacco history on file. - Review of Systems: Per HPI. - Past Medical History: Patient Active Problem List   Diagnosis Date Noted  . Hot flashes due to menopause 03/24/2016  . Chronic diastolic heart failure, NYHA class 1 ([place]) 06/30/2014  . Heart palpitations 06/30/2014  . S/P aortic valve replacement 02/03/2013  . Discomfort of chest wall 02/03/2013  . Long term current use of anticoagulant therapy 05/11/2012  . INSOMNIA 05/01/2009  . AORTIC VALVE REPLACEMENT, HX OF 03/28/2009  . FIBROIDS, UTERUS 02/19/2008  . Dyslipidemia 02/09/2008  . ABD/PELVIC SWELLING MASS/LUMP OTH [facility] SITE 02/09/2008   - Medications: reviewed and updated   Objective:   Physical Exam BP 122/70   Pulse 82   Temp 98.3 F (36.8 C) (O[facility])   Ht 5\' 2"  (1.575 m)   Wt 137 lb (62.1 kg)   LMP 03/17/2016   BMI 25.06 kg/m  Gen: NAD, alert, cooperative with exam, well-appearing HEENT: NCAT, PERRL, clear conjunctiva, oropharynx clear, supple neck CV:  RRR, good S1/S2, no murmur, no edema, capillary refill brisk  Resp: CTABL, no wheezes, non-labored Abd: SNTND, BS present, no guarding or organomegaly Skin: no rashes, normal turgor  Neuro: no gross deficits.  Psych: good insight, alert and oriented GU/GYN: Exam performed in the presence of a chaperone. External genitalia within normal limits.  Vaginal mucosa pink, moist, normal rugae.  Nonfriable cervix without lesions, no discharge or bleeding noted on speculum exam.  Bimanual exam revealed normal, minimally enlarged uterus.  No cervical motion tenderness. No adnexal masses bilaterally.    Assessment & Plan:   Hot flashes due to menopause Discussed that patient is not a good candidate for OTC herbal supplements due to potential interaction with coumadin therapy. Offered low dose Paxil for hot flashes. Patient declined and will call if she decides to start treatment.   Health Maintenance  -PAP performed today  -mammogram handout given  -colonoscopy handout given for patient once she turns 51  -fasting lipid panel ordered   Phill Myron, D.O. 03/24/2016, 2:21 PM PGY-2, [place]

## 2016-03-24 NOTE — Patient Instructions (Addendum)
Since you take Coumadin I wouldn't recommend anything over the counter for your hot flashes. A non-hormonal option is Paxil which is typically used for depression but at lower doses can benefit hot flashes. If you are interested in starting this medication please let me know.    I will call you with the results of your PAP in the next few days.   Please return for your blood work at [facility].   Get your mammogram next month and call the GI doctors after your 51th birthday for your colonoscopy.   Follow up in 1 year for your next check up or sooner if you have any problems.

## 2016-03-26 LAB — CYTOLOGY - PAP
ADEQUACY: ABSENT
DIAGNOSIS: NEGATIVE
HPV: DETECTED — AB

## 2016-03-31 ENCOUNTER — Ambulatory Visit (INDEPENDENT_AMBULATORY_CARE_PROVIDER_SITE_OTHER): Payer: Commercial Managed Care - HMO | Admitting: Pharmacist

## 2016-03-31 DIAGNOSIS — Z952 Presence of prosthetic heart valve: Secondary | ICD-10-CM | POA: Diagnosis not present

## 2016-03-31 DIAGNOSIS — Z7901 Long term (current) use of anticoagulants: Secondary | ICD-10-CM

## 2016-03-31 LAB — POCT INR: INR: 4.2

## 2016-04-01 ENCOUNTER — Other Ambulatory Visit: Payer: Commercial Managed Care - HMO

## 2016-04-05 ENCOUNTER — Ambulatory Visit (INDEPENDENT_AMBULATORY_CARE_PROVIDER_SITE_OTHER): Payer: Commercial Managed Care - HMO | Admitting: *Deleted

## 2016-04-05 DIAGNOSIS — Z111 Encounter for screening for respiratory tuberculosis: Secondary | ICD-10-CM

## 2016-04-05 NOTE — Progress Notes (Signed)
   PPD placed Left Forearm.  Pt to return 04/07/16 for reading.  Pt tolerated intradermal injection. Derl Barrow, RN

## 2016-04-07 ENCOUNTER — Encounter: Payer: Self-pay | Admitting: *Deleted

## 2016-04-07 ENCOUNTER — Ambulatory Visit (INDEPENDENT_AMBULATORY_CARE_PROVIDER_SITE_OTHER): Payer: Commercial Managed Care - HMO | Admitting: *Deleted

## 2016-04-07 DIAGNOSIS — Z111 Encounter for screening for respiratory tuberculosis: Secondary | ICD-10-CM

## 2016-04-07 NOTE — Progress Notes (Signed)
   PPD Reading Note PPD read and results entered in EpicCare. Result: 0 mm induration. Interpretation: Negative If test not read within 48-72 hours of initial placement, patient advised to repeat in other arm 1-3 weeks after this test. Allergic reaction: no  Deyonna Fitzsimmons L, RN  

## 2016-04-08 ENCOUNTER — Telehealth: Payer: Self-pay | Admitting: Internal Medicine

## 2016-04-08 DIAGNOSIS — R87810 Cervical high risk human papillomavirus (HPV) DNA test positive: Secondary | ICD-10-CM

## 2016-04-08 NOTE — Telephone Encounter (Signed)
Discussed result with Dr. Erin Hearing who recommended discussing option of repeat PAP in one year vs. Colposcopy now. Patient has had no prior abnormal PAPs (although did go 7 years between testing most recently). 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 Told patient that I would recommend colposcopy if she had had multiple sexual partners or history of several STDs. Patient elected to do repeat PAP in one year. Emphasized that it was important to be re-screened promptly in one year and that if PAP showed changes or was still HPV+ we would need to do colposcopy. Patient voiced understanding.   Phill Myron, D.O. 04/08/2016, 4:01 PM PGY-2, [place]

## 2016-04-20 ENCOUNTER — Other Ambulatory Visit: Payer: Self-pay | Admitting: Cardiology

## 2016-04-23 ENCOUNTER — Ambulatory Visit (INDEPENDENT_AMBULATORY_CARE_PROVIDER_SITE_OTHER): Payer: Commercial Managed Care - HMO | Admitting: Pharmacist Clinician (PhC)/ Clinical Pharmacy Specialist

## 2016-04-23 DIAGNOSIS — Z7901 Long term (current) use of anticoagulants: Secondary | ICD-10-CM

## 2016-04-23 DIAGNOSIS — Z952 Presence of prosthetic heart valve: Secondary | ICD-10-CM | POA: Diagnosis not present

## 2016-04-23 LAB — POCT INR: INR: 3.9

## 2016-05-07 ENCOUNTER — Ambulatory Visit (INDEPENDENT_AMBULATORY_CARE_PROVIDER_SITE_OTHER): Payer: Commercial Managed Care - HMO | Admitting: Pharmacist

## 2016-05-07 DIAGNOSIS — Z952 Presence of prosthetic heart valve: Secondary | ICD-10-CM | POA: Diagnosis not present

## 2016-05-07 DIAGNOSIS — Z7901 Long term (current) use of anticoagulants: Secondary | ICD-10-CM

## 2016-05-07 LAB — POCT INR: INR: 4.9

## 2016-05-24 ENCOUNTER — Ambulatory Visit (INDEPENDENT_AMBULATORY_CARE_PROVIDER_SITE_OTHER): Payer: Commercial Managed Care - HMO | Admitting: Pharmacist Clinician (PhC)/ Clinical Pharmacy Specialist

## 2016-05-24 DIAGNOSIS — Z952 Presence of prosthetic heart valve: Secondary | ICD-10-CM

## 2016-05-24 DIAGNOSIS — Z7901 Long term (current) use of anticoagulants: Secondary | ICD-10-CM

## 2016-05-24 LAB — POCT INR: INR: 3.4

## 2016-06-03 ENCOUNTER — Other Ambulatory Visit: Payer: Self-pay | Admitting: Internal Medicine

## 2016-06-03 ENCOUNTER — Other Ambulatory Visit: Payer: Self-pay | Admitting: Family Medicine

## 2016-06-03 DIAGNOSIS — Z1231 Encounter for screening mammogram for malignant neoplasm of breast: Secondary | ICD-10-CM

## 2016-06-07 ENCOUNTER — Ambulatory Visit (INDEPENDENT_AMBULATORY_CARE_PROVIDER_SITE_OTHER): Payer: Commercial Managed Care - HMO | Admitting: Pharmacist

## 2016-06-07 DIAGNOSIS — Z7901 Long term (current) use of anticoagulants: Secondary | ICD-10-CM

## 2016-06-07 DIAGNOSIS — Z952 Presence of prosthetic heart valve: Secondary | ICD-10-CM

## 2016-06-07 LAB — POCT INR: INR: 3.1

## 2016-07-12 ENCOUNTER — Ambulatory Visit (INDEPENDENT_AMBULATORY_CARE_PROVIDER_SITE_OTHER): Payer: Commercial Managed Care - HMO | Admitting: Pharmacist

## 2016-07-12 DIAGNOSIS — Z952 Presence of prosthetic heart valve: Secondary | ICD-10-CM | POA: Diagnosis not present

## 2016-07-12 DIAGNOSIS — Z7901 Long term (current) use of anticoagulants: Secondary | ICD-10-CM

## 2016-07-12 LAB — POCT INR: INR: 2.9

## 2016-07-15 ENCOUNTER — Inpatient Hospital Stay: Admission: RE | Admit: 2016-07-15 | Payer: Commercial Managed Care - HMO | Source: Ambulatory Visit

## 2016-08-11 ENCOUNTER — Ambulatory Visit (INDEPENDENT_AMBULATORY_CARE_PROVIDER_SITE_OTHER): Payer: Commercial Managed Care - HMO | Admitting: Pharmacist Clinician (PhC)/ Clinical Pharmacy Specialist

## 2016-08-11 DIAGNOSIS — Z952 Presence of prosthetic heart valve: Secondary | ICD-10-CM | POA: Diagnosis not present

## 2016-08-11 DIAGNOSIS — Z7901 Long term (current) use of anticoagulants: Secondary | ICD-10-CM

## 2016-08-11 LAB — POCT INR: INR: 2.7

## 2016-09-07 ENCOUNTER — Other Ambulatory Visit: Payer: Self-pay | Admitting: Cardiology

## 2016-09-08 ENCOUNTER — Ambulatory Visit (INDEPENDENT_AMBULATORY_CARE_PROVIDER_SITE_OTHER): Payer: 59 | Admitting: Pharmacist Clinician (PhC)/ Clinical Pharmacy Specialist

## 2016-09-08 DIAGNOSIS — Z7901 Long term (current) use of anticoagulants: Secondary | ICD-10-CM | POA: Diagnosis not present

## 2016-09-08 DIAGNOSIS — Z952 Presence of prosthetic heart valve: Secondary | ICD-10-CM

## 2016-09-08 LAB — POCT INR: INR: 4.4

## 2016-09-22 ENCOUNTER — Ambulatory Visit (INDEPENDENT_AMBULATORY_CARE_PROVIDER_SITE_OTHER): Payer: 59 | Admitting: Pharmacist

## 2016-09-22 DIAGNOSIS — Z7901 Long term (current) use of anticoagulants: Secondary | ICD-10-CM | POA: Diagnosis not present

## 2016-09-22 DIAGNOSIS — Z952 Presence of prosthetic heart valve: Secondary | ICD-10-CM | POA: Diagnosis not present

## 2016-09-22 LAB — POCT INR: INR: 3.3

## 2016-10-20 ENCOUNTER — Ambulatory Visit (INDEPENDENT_AMBULATORY_CARE_PROVIDER_SITE_OTHER): Payer: 59 | Admitting: Pharmacist Clinician (PhC)/ Clinical Pharmacy Specialist

## 2016-10-20 DIAGNOSIS — Z952 Presence of prosthetic heart valve: Secondary | ICD-10-CM | POA: Diagnosis not present

## 2016-10-20 DIAGNOSIS — Z7901 Long term (current) use of anticoagulants: Secondary | ICD-10-CM | POA: Diagnosis not present

## 2016-10-20 LAB — POCT INR: INR: 2.7

## 2016-11-17 ENCOUNTER — Ambulatory Visit (INDEPENDENT_AMBULATORY_CARE_PROVIDER_SITE_OTHER): Payer: 59 | Admitting: Pharmacist Clinician (PhC)/ Clinical Pharmacy Specialist

## 2016-11-17 DIAGNOSIS — Z952 Presence of prosthetic heart valve: Secondary | ICD-10-CM | POA: Diagnosis not present

## 2016-11-17 DIAGNOSIS — Z7901 Long term (current) use of anticoagulants: Secondary | ICD-10-CM | POA: Diagnosis not present

## 2016-11-17 LAB — POCT INR: INR: 2

## 2016-12-27 ENCOUNTER — Ambulatory Visit (INDEPENDENT_AMBULATORY_CARE_PROVIDER_SITE_OTHER): Payer: 59 | Admitting: Pharmacist Clinician (PhC)/ Clinical Pharmacy Specialist

## 2016-12-27 DIAGNOSIS — Z952 Presence of prosthetic heart valve: Secondary | ICD-10-CM

## 2016-12-27 DIAGNOSIS — Z7901 Long term (current) use of anticoagulants: Secondary | ICD-10-CM | POA: Diagnosis not present

## 2016-12-28 LAB — PROTIME-INR
INR: 2.5 — ABNORMAL HIGH (ref 0.8–1.2)
Prothrombin Time: 24.8 s — ABNORMAL HIGH (ref 9.1–12.0)

## 2017-01-26 ENCOUNTER — Ambulatory Visit (INDEPENDENT_AMBULATORY_CARE_PROVIDER_SITE_OTHER): Payer: Medicaid Other | Admitting: Pharmacist

## 2017-01-26 DIAGNOSIS — Z7901 Long term (current) use of anticoagulants: Secondary | ICD-10-CM | POA: Diagnosis not present

## 2017-01-26 LAB — POCT INR: INR: 2.2

## 2017-02-19 ENCOUNTER — Other Ambulatory Visit: Payer: Self-pay | Admitting: Cardiology

## 2017-02-25 ENCOUNTER — Ambulatory Visit (INDEPENDENT_AMBULATORY_CARE_PROVIDER_SITE_OTHER): Payer: Commercial Managed Care - HMO | Admitting: Pharmacist

## 2017-02-25 DIAGNOSIS — Z7901 Long term (current) use of anticoagulants: Secondary | ICD-10-CM

## 2017-02-25 DIAGNOSIS — Z952 Presence of prosthetic heart valve: Secondary | ICD-10-CM | POA: Diagnosis not present

## 2017-02-25 LAB — POCT INR: INR: 3.5

## 2017-03-25 ENCOUNTER — Ambulatory Visit (INDEPENDENT_AMBULATORY_CARE_PROVIDER_SITE_OTHER): Payer: Medicaid Other | Admitting: Pharmacist Clinician (PhC)/ Clinical Pharmacy Specialist

## 2017-03-25 DIAGNOSIS — Z7901 Long term (current) use of anticoagulants: Secondary | ICD-10-CM | POA: Diagnosis not present

## 2017-03-25 DIAGNOSIS — Z952 Presence of prosthetic heart valve: Secondary | ICD-10-CM | POA: Diagnosis not present

## 2017-03-25 LAB — POCT INR: INR: 2.8

## 2017-03-28 ENCOUNTER — Other Ambulatory Visit: Payer: Self-pay | Admitting: Cardiology

## 2017-04-11 NOTE — Progress Notes (Signed)
Cancelling order as lab not collected, no need to re-order 

## 2017-05-03 ENCOUNTER — Ambulatory Visit (INDEPENDENT_AMBULATORY_CARE_PROVIDER_SITE_OTHER): Payer: Medicaid Other | Admitting: Pharmacist Clinician (PhC)/ Clinical Pharmacy Specialist

## 2017-05-03 DIAGNOSIS — Z952 Presence of prosthetic heart valve: Secondary | ICD-10-CM | POA: Diagnosis not present

## 2017-05-03 DIAGNOSIS — Z7901 Long term (current) use of anticoagulants: Secondary | ICD-10-CM

## 2017-05-03 LAB — POCT INR: INR: 4.3

## 2017-05-11 IMAGING — CR DG CHEST 2V
2 series · 2 of 2 positions shown · non-contrast
Comparison: 07/19/2014

CLINICAL DATA: Chest pain

EXAM:
CHEST  2 VIEW

[w chest pa]
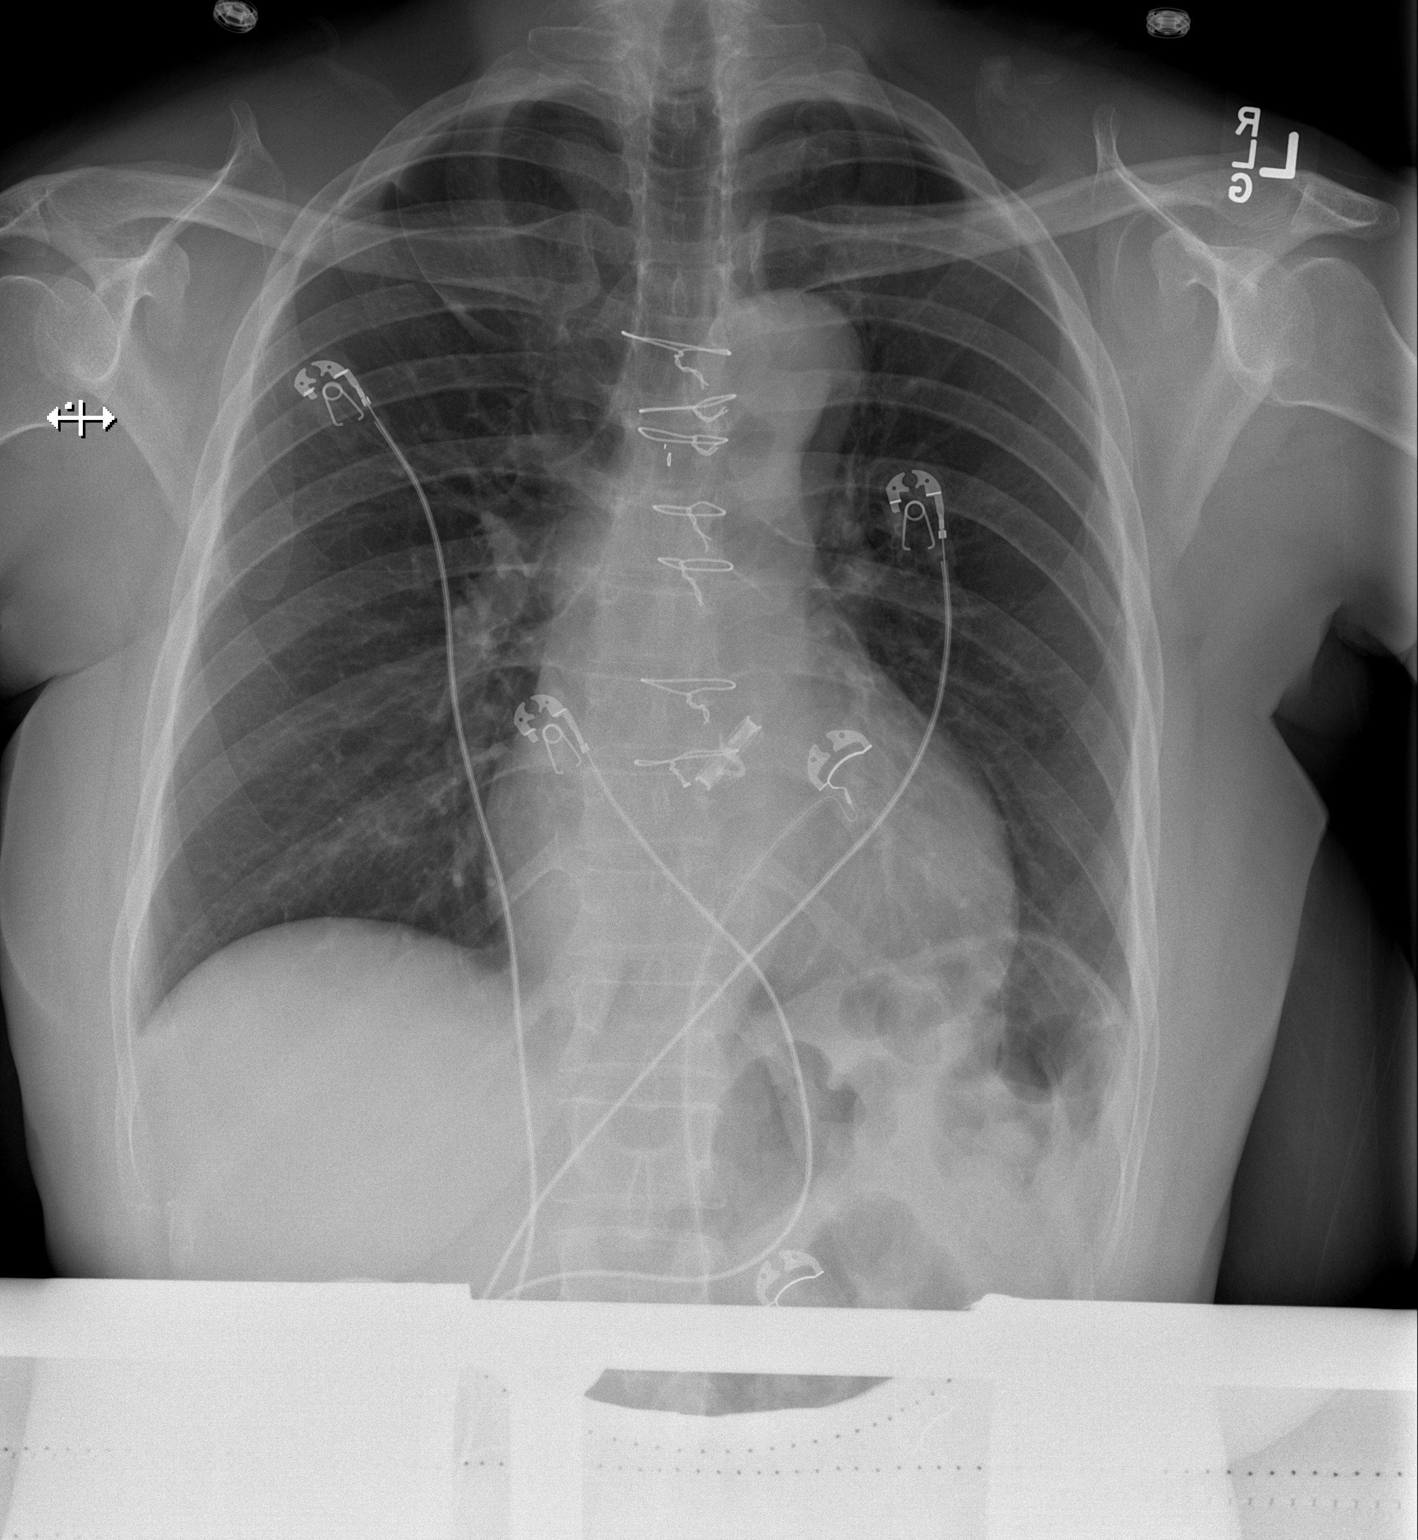

[w chest lat]
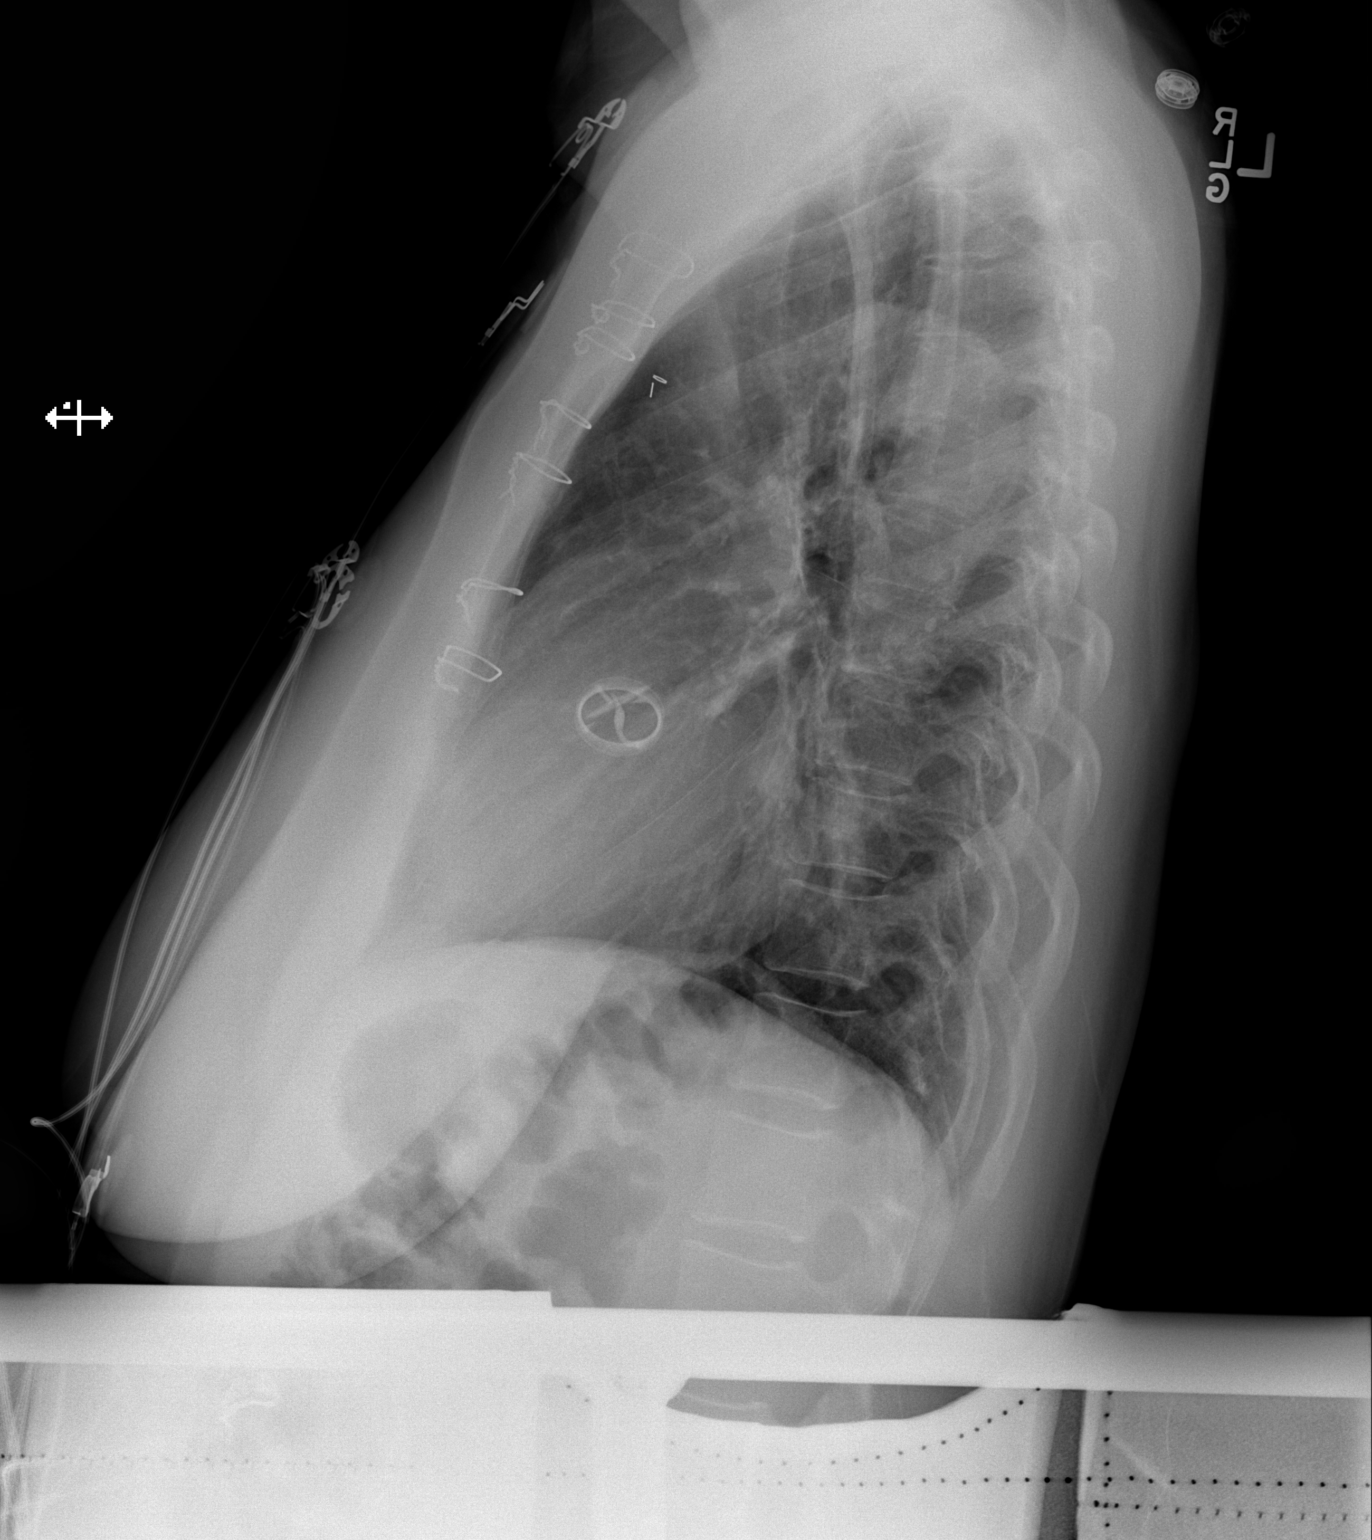

[2 of 2 positions shown; findings below may reference images not displayed]

FINDINGS: Chronic left ventricular enlargement. Status post aortic valve
replacement. Stable aortic and hilar contours. There is no edema,
consolidation, effusion, or pneumothorax. No acute osseous finding.
IMPRESSION: Stable exam.  No evidence of acute disease.

## 2017-06-07 ENCOUNTER — Ambulatory Visit (INDEPENDENT_AMBULATORY_CARE_PROVIDER_SITE_OTHER): Payer: Medicaid Other | Admitting: Pharmacist

## 2017-06-07 DIAGNOSIS — Z7901 Long term (current) use of anticoagulants: Secondary | ICD-10-CM

## 2017-06-07 DIAGNOSIS — Z952 Presence of prosthetic heart valve: Secondary | ICD-10-CM | POA: Diagnosis not present

## 2017-06-07 LAB — POCT INR: INR: 3.1

## 2017-07-05 ENCOUNTER — Ambulatory Visit (INDEPENDENT_AMBULATORY_CARE_PROVIDER_SITE_OTHER): Payer: Medicaid Other | Admitting: Pharmacist

## 2017-07-05 DIAGNOSIS — Z952 Presence of prosthetic heart valve: Secondary | ICD-10-CM

## 2017-07-05 DIAGNOSIS — Z7901 Long term (current) use of anticoagulants: Secondary | ICD-10-CM | POA: Diagnosis not present

## 2017-07-05 LAB — POCT INR: INR: 2.3

## 2017-08-02 ENCOUNTER — Ambulatory Visit (INDEPENDENT_AMBULATORY_CARE_PROVIDER_SITE_OTHER): Payer: Medicaid Other | Admitting: Pharmacist Clinician (PhC)/ Clinical Pharmacy Specialist

## 2017-08-02 DIAGNOSIS — Z7901 Long term (current) use of anticoagulants: Secondary | ICD-10-CM | POA: Diagnosis not present

## 2017-08-02 DIAGNOSIS — Z952 Presence of prosthetic heart valve: Secondary | ICD-10-CM

## 2017-08-02 LAB — POCT INR: INR: 3.5 — AB (ref 2.0–3.0)

## 2017-08-31 ENCOUNTER — Ambulatory Visit (INDEPENDENT_AMBULATORY_CARE_PROVIDER_SITE_OTHER): Payer: Medicaid Other | Admitting: Pharmacist Clinician (PhC)/ Clinical Pharmacy Specialist

## 2017-08-31 DIAGNOSIS — Z7901 Long term (current) use of anticoagulants: Secondary | ICD-10-CM | POA: Diagnosis not present

## 2017-08-31 DIAGNOSIS — Z952 Presence of prosthetic heart valve: Secondary | ICD-10-CM

## 2017-08-31 LAB — POCT INR: INR: 2.7 (ref 2.0–3.0)

## 2017-09-06 ENCOUNTER — Other Ambulatory Visit: Payer: Self-pay | Admitting: Cardiology

## 2017-09-28 ENCOUNTER — Ambulatory Visit (INDEPENDENT_AMBULATORY_CARE_PROVIDER_SITE_OTHER): Payer: Medicaid Other | Admitting: Pharmacist Clinician (PhC)/ Clinical Pharmacy Specialist

## 2017-09-28 DIAGNOSIS — Z7901 Long term (current) use of anticoagulants: Secondary | ICD-10-CM

## 2017-09-28 DIAGNOSIS — Z952 Presence of prosthetic heart valve: Secondary | ICD-10-CM

## 2017-09-28 LAB — POCT INR: INR: 3.3 — AB (ref 2.0–3.0)

## 2017-10-28 ENCOUNTER — Ambulatory Visit (INDEPENDENT_AMBULATORY_CARE_PROVIDER_SITE_OTHER): Payer: Medicaid Other | Admitting: Pharmacist Clinician (PhC)/ Clinical Pharmacy Specialist

## 2017-10-28 DIAGNOSIS — Z7901 Long term (current) use of anticoagulants: Secondary | ICD-10-CM | POA: Diagnosis not present

## 2017-10-28 DIAGNOSIS — Z952 Presence of prosthetic heart valve: Secondary | ICD-10-CM | POA: Diagnosis not present

## 2017-10-28 LAB — POCT INR: INR: 2.7 (ref 2.0–3.0)

## 2017-11-01 ENCOUNTER — Telehealth: Payer: Self-pay | Admitting: Cardiology

## 2017-11-01 NOTE — Telephone Encounter (Signed)
   [facility] Pre-operative Risk Assessment    Request for surgical clearance:  1. What type of surgery is being performed? Dental extraction x2   2. When is this surgery scheduled? TBD   3. What type of clearance is required (medical clearance vs. Pharmacy clearance to hold med vs. Both)? Pharmacy   4. Are there any medications that need to be held prior to surgery and how long? Warfarin    5. Practice name and name of physician performing surgery? Dr. Diona Browner @ [facility], [place]   6. What is your office phone number [phone]     7.   What is your office fax number [phone]  8.   Anesthesia type (None, local, MAC, general) ? Local, nitrous  ** last MD OV 06/2014   Sheral Apley M 11/01/2017, 1:51 PM  _________________________________________________________________   (provider comments below)

## 2017-11-02 NOTE — Telephone Encounter (Signed)
Per office protocol, we would not recommend holding warfarin for the extraction of 1-2 teeth.  If provider would prefer, we can check INR 1-2 days prior and adjust dose to be at lower end of goal range.  She will require SBE prophylaxis with Amoxacillin 2 gm po 30-60 minutes pre op.   Kerin Ransom PA-C 11/02/2017 4:06 PM

## 2017-11-02 NOTE — Telephone Encounter (Signed)
Patient with diagnosis of aortic valve replacement on warfarin for anticoagulation.    Procedure: dental extractions x 2 Date of procedure: TBD  No labs available for past 2 years  Per office protocol, we would not recommend holding warfarin for the extraction of 1-2 teeth.  If provider would prefer, we can check INR 1-2 days prior and adjust dose to be at lower end of goal range.

## 2017-11-25 ENCOUNTER — Ambulatory Visit (INDEPENDENT_AMBULATORY_CARE_PROVIDER_SITE_OTHER): Payer: Medicaid Other | Admitting: Pharmacist

## 2017-11-25 DIAGNOSIS — Z952 Presence of prosthetic heart valve: Secondary | ICD-10-CM

## 2017-11-25 DIAGNOSIS — Z7901 Long term (current) use of anticoagulants: Secondary | ICD-10-CM

## 2017-11-25 LAB — POCT INR: INR: 3.8 — AB (ref 2.0–3.0)

## 2017-12-13 ENCOUNTER — Ambulatory Visit: Payer: Medicaid Other | Attending: Family Medicine | Admitting: Physical Therapy

## 2017-12-16 ENCOUNTER — Ambulatory Visit (INDEPENDENT_AMBULATORY_CARE_PROVIDER_SITE_OTHER): Payer: Medicaid Other | Admitting: Pharmacist

## 2017-12-16 DIAGNOSIS — Z952 Presence of prosthetic heart valve: Secondary | ICD-10-CM

## 2017-12-16 DIAGNOSIS — Z7901 Long term (current) use of anticoagulants: Secondary | ICD-10-CM | POA: Diagnosis not present

## 2017-12-16 LAB — POCT INR: INR: 2.2 (ref 2.0–3.0)

## 2018-01-11 ENCOUNTER — Other Ambulatory Visit: Payer: Self-pay

## 2018-01-11 ENCOUNTER — Encounter: Payer: Self-pay | Admitting: Physical Therapy

## 2018-01-11 ENCOUNTER — Ambulatory Visit: Payer: Medicaid Other | Attending: Family Medicine | Admitting: Physical Therapy

## 2018-01-11 DIAGNOSIS — M25662 Stiffness of left knee, not elsewhere classified: Secondary | ICD-10-CM | POA: Diagnosis present

## 2018-01-11 DIAGNOSIS — R2689 Other abnormalities of gait and mobility: Secondary | ICD-10-CM | POA: Diagnosis present

## 2018-01-11 DIAGNOSIS — M6281 Muscle weakness (generalized): Secondary | ICD-10-CM

## 2018-01-11 DIAGNOSIS — G8929 Other chronic pain: Secondary | ICD-10-CM

## 2018-01-11 DIAGNOSIS — M25562 Pain in left knee: Secondary | ICD-10-CM | POA: Diagnosis present

## 2018-01-12 NOTE — Therapy (Addendum)
[place], [place], [address] Phone: [phone]   Fax:  [phone]  Physical Therapy Evaluation  Patient Details  Name: Tonya Mcintosh MRN: [id number] Date of Birth: 12/18/1965 Referring Provider (PT): Elenore Paddy NP   Encounter Date: 01/11/2018  PT End of Session - 01/11/18 1552    Visit Number  1    Number of Visits  4    Date for PT Re-Evaluation  02/08/18    Authorization Type  Medicaid     PT Start Time  1500    PT Stop Time  1543    PT Time Calculation (min)  43 min    Activity Tolerance  Treatment limited secondary to medical complications (Comment)    Behavior During Therapy  [facility] for tasks assessed/performed       Past Medical History:  Diagnosis Date  . Abnormal vaginal Pap smear    CIN I  . Aortic valve replaced 1987; September 2010  . Endocarditis 2006   Treated w/ antibiotics long term, doing well since.  . H/O insomnia   . Hypercholesterolemia   . Hyperlipidemia   . Kidney calculi   . Kidney infection   . VSD (ventricular septal defect) 1979   repair    Past Surgical History:  Procedure Laterality Date  . AORTIC VALVE REPLACEMENT  07/17/1985   23-mm Medtronic Hall prosthesis  . CARDIAC CATHETERIZATION  01/25/1985   Preop 10 2010, no significant CAD.  Marland Kitchen CARDIOVASCULAR STRESS TEST  09/22/2008   Fixed defect at the apex. No stress induced ischemia.  Marland Kitchen MECHANICAL AORTIC VALVE REPLACEMENT  October 2010   Medtronic prosthetic valve exchanged for a St. Jude tactile prosthesis (bileaflet)  . TRANSTHORACIC ECHOCARDIOGRAM  12/25/2008   EF >55%, doppler evidence of regurg is probably normal of St Jude prosthetic aortic valve.  . TRANSTHORACIC ECHOCARDIOGRAM  03/19/2013   EF 60-65%; ? Grade 3 diastolic dysfunction; Mech Prosthesis of AoV -mild-mod AI. Mildly increased gradient.  . TUBAL LIGATION  1986  . VSD REPAIR  02-22-77    There were no vitals filed for this  visit.   Subjective Assessment - 01/11/18 1503    Subjective  Last year around this time pt reports both knees just started hurting. Pain Bil but R>L currently. Up until 2-3 months ago the pain was worse in L but currently pain worse on R. Pt also reports long standing low back pain. 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 Pt also reports taking Vicodin but unsure of dosage.      Limitations  Standing;Walking    Diagnostic tests  Recent imaging not on file     Patient Stated Goals  Decreased pain to be able to negotiate stairs     Currently in Pain?  Yes    Pain Score  7    9/10 with stairs   Pain Location  Knee    Pain Orientation  Right    Pain Descriptors / Indicators  Aching    Pain Type  Chronic pain    Pain Onset  More than a month ago    Pain Frequency  Constant    Aggravating Factors   Standing, walking, but mainly stairs     Pain Relieving Factors  None     Multiple Pain Sites  Yes    Pain Score  6    Pain Location  Knee  Pain Descriptors / Indicators  Aching    Pain Type  Chronic pain    Pain Onset  More than a month ago    Pain Frequency  Constant    Aggravating Factors   stairs, standing, walking     Pain Relieving Factors  None          [facility] PT Assessment - 01/12/18 0001      Assessment   Medical Diagnosis  Right knee pain     Referring Provider (PT)  Lowella Fairy Jerilee Hoh NP    Onset Date/Surgical Date  --   over a year ago   Next MD Visit  None schedueled    Prior Therapy  No      Precautions   Precautions  None      Restrictions   Weight Bearing Restrictions  No      Balance Screen   Has the patient fallen in the past 6 months  No    Has the patient had a decrease in activity level because of a fear of falling?   No    Is the patient reluctant to leave their home because of a fear of falling?   No      Home Environment   Additional Comments  lives on second level; must go up  12 stairs - one hand rail      Prior Function   Vocation  On disability    Leisure  sweep       Cognition   Overall Cognitive Status  Within Functional Limits for tasks assessed    Attention  Focused    Focused Attention  Appears intact    Memory  Appears intact    Awareness  Appears intact    Problem Solving  Appears intact      Sensation   Light Touch  Appears Intact    Stereognosis  Appears Intact    Hot/Cold  Appears Intact    Proprioception  Appears Intact    Additional Comments  Denies paresthesias      Coordination   Gross Motor Movements are Fluid and Coordinated  Yes    Fine Motor Movements are Fluid and Coordinated  Yes      AROM   Right Knee Extension  0    Right Knee Flexion  133    Left Knee Extension  132    Left Knee Flexion  -1      PROM   Overall PROM Comments  R hip flexion 100 ; R hip ABD WFL   increase in back pain   Right Hip Internal Rotation   10    Left Hip Flexion  100   pain in back   Left Hip Internal Rotation   10    Left Hip ABduction  WFL    Left Hip ADduction  WFL      Strength   Right Hip Flexion  4-/5    Right Hip ABduction  3+/5    Right Hip ADduction  3+/5    Left Hip Flexion  4-/5    Left Hip ABduction  4-/5    Left Hip ADduction  4-/5    Right Knee Flexion  3+/5    Right Knee Extension  3+/5    Left Knee Flexion  4-/5    Left Knee Extension  4-/5      Palpation   Patella mobility  Decreased patella mobility on R > L ; Mild crepitus on L patellar mobility  Special Tests    Special Tests  Knee Special Tests    Other special tests  Valgus stress test (-) Bil; varus stress test (-) Bil; Mcmurrays test (-) on R       Ambulation/Gait   Gait Comments  Bil knee valgus; decreased heel strike/ toe off ; decreased gait velocity for age                 Objective measurements completed on examination: See above findings.      [facility] Adult PT Treatment/Exercise - 01/12/18 0001      Self-Care   Self-Care   Heat/Ice Application;Other Self-Care Comments    Other Self-Care Comments   Log roll to improve bed mobility       Knee/Hip Exercises: Stretches   Active Hamstring Stretch Limitations  x30sec Bil with strap    Other Knee/Hip Stretches  heel slide bil with strap x30sec       Knee/Hip Exercises: Supine   Quad Sets  1 set;5 reps   Bil   Straight Leg Raises Limitations  1x5 Bil   with cues for ab set            PT Education - 01/11/18 1546    Education Details  HEP; symptom management with modalities     Person(s) Educated  Patient    Methods  Explanation;Demonstration;Verbal cues;Tactile cues;Handout    Comprehension  Verbalized understanding;Returned demonstration;Verbal cues required;Tactile cues required       PT Short Term Goals - 01/11/18 1655      PT SHORT TERM GOAL #1   Title  Pt will increase bil knee extensor strength to 4-/5     Baseline  3+/5     Time  3    Period  Weeks    Status  New    Target Date  02/01/18      PT SHORT TERM GOAL #2   Title  Pt will report <4/10 pain negotiating 12 steps into house     Baseline  9/10 pain going up 12 steps    Time  3    Period  Weeks    Status  New    Target Date  02/01/18      PT SHORT TERM GOAL #3   Title  Pt will increase L hip flexor strength to 4/5 in order to support knees     Baseline  4-/5    Time  3    Period  Weeks    Status  New    Target Date  02/02/18                Plan - 01/11/18 1747    Clinical Impression Statement  Pt is a 52 y.o. 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  Patient reports pain was originally worse in L knee but in past few months pain is worse on the R knee. Pt reports the pain limits her most with stair negotation as she has to ascend/descend 12 steps to enter and exit house. Pt with bil LE weakness and decrease patellar mobility on R>L, but demonstrates bil knee ROM WFL. Pt will benefit from skilled  PT to improve strength,pain, and overall function in order to safely negotation stairs and community .     History and Personal Factors relevant to plan of care:  Chronic diastolic heart failure , long term use of anticoagulation therapy     Clinical Presentation  Evolving  Clinical Decision Making  Moderate    Rehab Potential  Fair    PT Frequency  1x / week    PT Duration  3 weeks    PT Treatment/Interventions  ADLs/Self Care Home Management;Cryotherapy;Electrical Stimulation;Iontophoresis 4mg /ml Dexamethasone;Moist Heat;Traction;Ultrasound;Gait training;Stair training;Functional mobility training;Therapeutic activities;Therapeutic exercise;Neuromuscular re-education;Balance training;Patient/family education;Manual techniques;Passive range of motion;Taping;Vasopneumatic Device    PT Next Visit Plan  patella mobilizations, hamstring and gastroc stretches, heel slides, quad sets, short arc quad, straight leg raises, hamstring curls, clamshells, bent knee raises, toe/heel raises, taping    PT Home Exercise Plan  heel slides, hamstring stretch, quad sets     Consulted and Agree with Plan of Care  Patient       Patient will benefit from skilled therapeutic intervention in order to improve the following deficits and impairments:  Abnormal gait, Pain, Improper body mechanics, Hypomobility, Decreased strength, Decreased range of motion, Decreased endurance, Decreased activity tolerance, Postural dysfunction, Difficulty walking  Visit Diagnosis: Chronic pain of left knee  Stiffness of left knee, not elsewhere classified  Muscle weakness (generalized)  Other abnormalities of gait and mobility     Problem List Patient Active Problem List   Diagnosis Date Noted  . Cervical high risk HPV (human papillomavirus) test positive 04/08/2016  . Hot flashes due to menopause 03/24/2016  . Chronic diastolic heart failure, NYHA class 1 ([place]) 06/30/2014  . Heart palpitations 06/30/2014  . S/P aortic  valve replacement 02/03/2013  . Discomfort of chest wall 02/03/2013  . Long term current use of anticoagulant therapy 05/11/2012  . INSOMNIA 05/01/2009  . AORTIC VALVE REPLACEMENT, HX OF 03/28/2009  . FIBROIDS, UTERUS 02/19/2008  . Dyslipidemia 02/09/2008  . ABD/PELVIC SWELLING MASS/LUMP OTH [facility] SITE 02/09/2008    Carolyne Littles PT DPT  01/12/2018   Einar Crow  SPT 01/12/2018, 8:08 AM  During this treatment session, the therapist was present, participating in and directing the treatment.   [place], [place], [address] Phone: [phone]   Fax:  [phone]  Name: Tonya Mcintosh MRN: [id number] Date of Birth: 1965-07-31

## 2018-01-13 ENCOUNTER — Ambulatory Visit (INDEPENDENT_AMBULATORY_CARE_PROVIDER_SITE_OTHER): Payer: Medicaid Other | Admitting: Pharmacist Clinician (PhC)/ Clinical Pharmacy Specialist

## 2018-01-13 DIAGNOSIS — Z7901 Long term (current) use of anticoagulants: Secondary | ICD-10-CM | POA: Diagnosis not present

## 2018-01-13 DIAGNOSIS — Z952 Presence of prosthetic heart valve: Secondary | ICD-10-CM

## 2018-01-13 LAB — POCT INR: INR: 3.4 — AB (ref 2.0–3.0)

## 2018-01-24 ENCOUNTER — Ambulatory Visit: Payer: Medicaid Other | Admitting: Physical Therapy

## 2018-01-27 ENCOUNTER — Other Ambulatory Visit: Payer: Self-pay | Admitting: Cardiology

## 2018-01-31 ENCOUNTER — Ambulatory Visit: Payer: Medicaid Other | Admitting: Physical Therapy

## 2018-01-31 ENCOUNTER — Telehealth: Payer: Self-pay | Admitting: Physical Therapy

## 2018-01-31 NOTE — Telephone Encounter (Signed)
Patient's daughter answered phone call regarding no show. She will remind her of her next appointment.

## 2018-02-07 ENCOUNTER — Ambulatory Visit: Payer: Medicaid Other | Admitting: Physical Therapy

## 2018-02-10 ENCOUNTER — Ambulatory Visit (INDEPENDENT_AMBULATORY_CARE_PROVIDER_SITE_OTHER): Payer: Medicaid Other | Admitting: Pharmacist

## 2018-02-10 DIAGNOSIS — Z7901 Long term (current) use of anticoagulants: Secondary | ICD-10-CM

## 2018-02-10 DIAGNOSIS — Z952 Presence of prosthetic heart valve: Secondary | ICD-10-CM

## 2018-02-10 LAB — POCT INR: INR: 3 (ref 2.0–3.0)

## 2018-02-14 ENCOUNTER — Ambulatory Visit: Payer: Medicaid Other | Admitting: Physical Therapy

## 2018-03-13 ENCOUNTER — Telehealth: Payer: Self-pay

## 2018-03-13 NOTE — Telephone Encounter (Signed)
Called pt to schedule overdue inr left msg 

## 2018-04-07 ENCOUNTER — Ambulatory Visit (INDEPENDENT_AMBULATORY_CARE_PROVIDER_SITE_OTHER): Payer: Medicaid Other | Admitting: Pharmacist Clinician (PhC)/ Clinical Pharmacy Specialist

## 2018-04-07 DIAGNOSIS — Z7901 Long term (current) use of anticoagulants: Secondary | ICD-10-CM | POA: Diagnosis not present

## 2018-04-07 DIAGNOSIS — Z952 Presence of prosthetic heart valve: Secondary | ICD-10-CM | POA: Diagnosis not present

## 2018-04-07 LAB — POCT INR: INR: 4 — AB (ref 2.0–3.0)

## 2018-04-07 MED ORDER — WARFARIN SODIUM 3 MG PO TABS: ORAL_TABLET | ORAL | 1 refills | Status: DC

## 2018-04-28 ENCOUNTER — Ambulatory Visit (INDEPENDENT_AMBULATORY_CARE_PROVIDER_SITE_OTHER): Payer: Medicaid Other | Admitting: Pharmacist Clinician (PhC)/ Clinical Pharmacy Specialist

## 2018-04-28 DIAGNOSIS — Z7901 Long term (current) use of anticoagulants: Secondary | ICD-10-CM

## 2018-04-28 DIAGNOSIS — Z952 Presence of prosthetic heart valve: Secondary | ICD-10-CM

## 2018-04-28 LAB — POCT INR: INR: 5.2 — AB (ref 2.0–3.0)

## 2018-04-28 NOTE — Patient Instructions (Signed)
No warfarin Friday March 6 or Saturday March 7, then decrease dose to 1 tablet every day except 1/2 tablet every Monday, Wednesday and  Friday. Recheck INR in 2 weeks.

## 2018-05-11 ENCOUNTER — Telehealth: Payer: Self-pay

## 2018-05-11 NOTE — Telephone Encounter (Signed)
LMOM FOR PRESCREEN  

## 2018-05-12 ENCOUNTER — Telehealth: Payer: Self-pay | Admitting: *Deleted

## 2018-05-12 NOTE — Telephone Encounter (Signed)
1. Have you recently travelled abroad or to [place], [place], or [place]? No  2. Do you currently have a fever? No 3. Have you been in contact with someone that is currently pending confirmation of COVID19 testing or has been confirmed to have the COVID19 virus? No 4. Are you currently experiencing fatigue or cough? No 5. Are you currently experiencing new or worsening shortness of breath at rest or with minimal activity? No 6. Have you been in contact with someone that was recently sick with fever/cough/fatigue? No   **A score of 4 or more should result in cancellation of the pts cardiology appt  **A score of 2 should be provided a mask prior to admission into the lobby  **TRAVEL to a high risk area or contact with a confirmed case should stay at home, away from confirmed patient, monitor symptoms, and reach out to PCP for evisit, additional testing.   **ALL PTS WITH FEVER SHOULD BE REFERRED TO PCP FOR EVISIT Per Pts daughter, pt has not had any symptoms.  Pt. Advised that we are restricting visitors at this time and request that only patients present for check-in prior to their appointment. All other visitors should remain in their car. If necessary, only one visitor may come with the patient into the building. For everyone's safety, all patients and visitors entering our practice area should expect to be screened again prior to entering our waiting area.

## 2018-05-15 ENCOUNTER — Other Ambulatory Visit: Payer: Self-pay

## 2018-05-15 ENCOUNTER — Ambulatory Visit (INDEPENDENT_AMBULATORY_CARE_PROVIDER_SITE_OTHER): Payer: Medicaid Other | Admitting: Pharmacist

## 2018-05-15 DIAGNOSIS — Z952 Presence of prosthetic heart valve: Secondary | ICD-10-CM | POA: Diagnosis not present

## 2018-05-15 DIAGNOSIS — Z7901 Long term (current) use of anticoagulants: Secondary | ICD-10-CM | POA: Diagnosis not present

## 2018-05-15 LAB — POCT INR: INR: 4.7 — AB (ref 2.0–3.0)

## 2018-06-02 ENCOUNTER — Telehealth: Payer: Self-pay

## 2018-06-02 NOTE — Telephone Encounter (Signed)
lmom for prescreen/drive thru 

## 2018-06-05 ENCOUNTER — Other Ambulatory Visit: Payer: Self-pay

## 2018-06-05 ENCOUNTER — Ambulatory Visit (INDEPENDENT_AMBULATORY_CARE_PROVIDER_SITE_OTHER): Payer: Medicaid Other | Admitting: *Deleted

## 2018-06-05 DIAGNOSIS — Z952 Presence of prosthetic heart valve: Secondary | ICD-10-CM | POA: Diagnosis not present

## 2018-06-05 DIAGNOSIS — Z7901 Long term (current) use of anticoagulants: Secondary | ICD-10-CM

## 2018-06-05 LAB — POCT INR: INR: 4.9 — AB (ref 2.0–3.0)

## 2018-06-05 NOTE — Patient Instructions (Signed)
Description   Spoke with pt and instructed to hold today's dose and take 1/2 tablet tomorrow then change dose to 1/2 tablet daily except 1 tablet on Sunday, Tuesday, and Thursday. Recheck INR in 2 weeks.

## 2018-06-16 ENCOUNTER — Telehealth: Payer: Self-pay

## 2018-06-16 NOTE — Telephone Encounter (Signed)
lmom for prescreen  

## 2018-07-04 ENCOUNTER — Other Ambulatory Visit: Payer: Self-pay

## 2018-07-04 ENCOUNTER — Ambulatory Visit (INDEPENDENT_AMBULATORY_CARE_PROVIDER_SITE_OTHER): Payer: Medicaid Other

## 2018-07-04 DIAGNOSIS — Z7901 Long term (current) use of anticoagulants: Secondary | ICD-10-CM | POA: Diagnosis not present

## 2018-07-04 DIAGNOSIS — Z952 Presence of prosthetic heart valve: Secondary | ICD-10-CM | POA: Diagnosis not present

## 2018-07-04 LAB — POCT INR: INR: 3.3 — AB (ref 2.0–3.0)

## 2018-07-04 NOTE — Patient Instructions (Signed)
Description   Spoke with pt and instructed to continue on same dosage 1/2 tablet daily except 1 tablet on Sundays, Tuesdays, and Thursdays. Recheck INR in 4 weeks.

## 2018-07-26 ENCOUNTER — Telehealth: Payer: Self-pay

## 2018-07-26 NOTE — Telephone Encounter (Signed)
lmom for prescreen and move appt

## 2018-07-31 NOTE — Telephone Encounter (Signed)

## 2018-08-02 ENCOUNTER — Other Ambulatory Visit: Payer: Self-pay

## 2018-08-02 ENCOUNTER — Ambulatory Visit (INDEPENDENT_AMBULATORY_CARE_PROVIDER_SITE_OTHER): Payer: Medicaid Other | Admitting: Pharmacist Clinician (PhC)/ Clinical Pharmacy Specialist

## 2018-08-02 DIAGNOSIS — Z952 Presence of prosthetic heart valve: Secondary | ICD-10-CM | POA: Diagnosis not present

## 2018-08-02 DIAGNOSIS — Z7901 Long term (current) use of anticoagulants: Secondary | ICD-10-CM | POA: Diagnosis not present

## 2018-08-02 LAB — POCT INR: INR: 3 (ref 2.0–3.0)

## 2018-09-11 ENCOUNTER — Telehealth: Payer: Self-pay

## 2018-09-11 NOTE — Telephone Encounter (Signed)
lmom for prescreen  

## 2018-09-15 ENCOUNTER — Telehealth: Payer: Self-pay

## 2018-09-15 NOTE — Telephone Encounter (Signed)
Left a voice message for the patient informing her that I was calling to go over the [place] prescreen questions with her before her upcoming appointment.

## 2018-09-18 ENCOUNTER — Ambulatory Visit (INDEPENDENT_AMBULATORY_CARE_PROVIDER_SITE_OTHER): Payer: Medicaid Other | Admitting: *Deleted

## 2018-09-18 ENCOUNTER — Other Ambulatory Visit: Payer: Self-pay

## 2018-09-18 DIAGNOSIS — Z7901 Long term (current) use of anticoagulants: Secondary | ICD-10-CM

## 2018-09-18 DIAGNOSIS — Z952 Presence of prosthetic heart valve: Secondary | ICD-10-CM

## 2018-09-18 LAB — POCT INR: INR: 2.1 (ref 2.0–3.0)

## 2018-09-18 NOTE — Patient Instructions (Signed)
Description   Today take 1 tablet, then continue on same dosage 1/2 tablet daily except 1 tablet on Sundays, Tuesdays, and Thursdays. Recheck INR in 2 weeks.

## 2018-10-16 ENCOUNTER — Telehealth: Payer: Self-pay

## 2018-10-16 NOTE — Telephone Encounter (Signed)
lmom overdue inr °

## 2018-11-01 ENCOUNTER — Ambulatory Visit (INDEPENDENT_AMBULATORY_CARE_PROVIDER_SITE_OTHER): Payer: Medicaid Other | Admitting: Pharmacist

## 2018-11-01 ENCOUNTER — Other Ambulatory Visit: Payer: Self-pay

## 2018-11-01 DIAGNOSIS — Z7901 Long term (current) use of anticoagulants: Secondary | ICD-10-CM | POA: Diagnosis not present

## 2018-11-01 DIAGNOSIS — Z952 Presence of prosthetic heart valve: Secondary | ICD-10-CM

## 2018-11-01 LAB — POCT INR: INR: 1.7 — AB (ref 2.0–3.0)

## 2018-11-23 ENCOUNTER — Telehealth: Payer: Self-pay

## 2018-11-23 NOTE — Telephone Encounter (Signed)
lmomed for overdue inr 

## 2018-12-13 ENCOUNTER — Other Ambulatory Visit: Payer: Self-pay

## 2018-12-13 ENCOUNTER — Ambulatory Visit (INDEPENDENT_AMBULATORY_CARE_PROVIDER_SITE_OTHER): Payer: Medicaid Other | Admitting: Pharmacist Clinician (PhC)/ Clinical Pharmacy Specialist

## 2018-12-13 DIAGNOSIS — Z952 Presence of prosthetic heart valve: Secondary | ICD-10-CM | POA: Diagnosis not present

## 2018-12-13 DIAGNOSIS — Z7901 Long term (current) use of anticoagulants: Secondary | ICD-10-CM

## 2018-12-13 LAB — POCT INR: INR: 2.3 (ref 2.0–3.0)

## 2018-12-13 NOTE — Patient Instructions (Signed)
Take 1 tablet today Wednesday Oct 21,  then increase dose to 1 tablet daily except 1/2 tablet each Monday, Wednesday and Friday.  Repeat INR in 2 weeks

## 2019-01-04 ENCOUNTER — Telehealth: Payer: Self-pay

## 2019-01-04 NOTE — Telephone Encounter (Signed)
lmom for overdue inr 

## 2019-01-15 ENCOUNTER — Other Ambulatory Visit: Payer: Self-pay

## 2019-01-15 ENCOUNTER — Ambulatory Visit (INDEPENDENT_AMBULATORY_CARE_PROVIDER_SITE_OTHER): Payer: Medicaid Other | Admitting: Pharmacist Clinician (PhC)/ Clinical Pharmacy Specialist

## 2019-01-15 DIAGNOSIS — Z7901 Long term (current) use of anticoagulants: Secondary | ICD-10-CM | POA: Diagnosis not present

## 2019-01-15 DIAGNOSIS — Z952 Presence of prosthetic heart valve: Secondary | ICD-10-CM

## 2019-01-15 LAB — POCT INR: INR: 2.5 (ref 2.0–3.0)

## 2019-02-05 ENCOUNTER — Other Ambulatory Visit: Payer: Self-pay | Admitting: Cardiology

## 2019-02-22 ENCOUNTER — Telehealth: Payer: Self-pay

## 2019-02-22 NOTE — Telephone Encounter (Signed)
lmom for overdue inr 

## 2019-03-23 ENCOUNTER — Other Ambulatory Visit: Payer: Self-pay

## 2019-03-23 ENCOUNTER — Ambulatory Visit (INDEPENDENT_AMBULATORY_CARE_PROVIDER_SITE_OTHER): Payer: Medicaid Other | Admitting: Pharmacist Clinician (PhC)/ Clinical Pharmacy Specialist

## 2019-03-23 DIAGNOSIS — Z7901 Long term (current) use of anticoagulants: Secondary | ICD-10-CM

## 2019-03-23 DIAGNOSIS — Z952 Presence of prosthetic heart valve: Secondary | ICD-10-CM | POA: Diagnosis not present

## 2019-03-23 LAB — POCT INR: INR: 2.5 (ref 2.0–3.0)

## 2019-05-11 ENCOUNTER — Ambulatory Visit (INDEPENDENT_AMBULATORY_CARE_PROVIDER_SITE_OTHER): Payer: Medicaid Other | Admitting: Pharmacist Clinician (PhC)/ Clinical Pharmacy Specialist

## 2019-05-11 ENCOUNTER — Other Ambulatory Visit: Payer: Self-pay

## 2019-05-11 DIAGNOSIS — Z952 Presence of prosthetic heart valve: Secondary | ICD-10-CM

## 2019-05-11 DIAGNOSIS — Z7901 Long term (current) use of anticoagulants: Secondary | ICD-10-CM

## 2019-05-11 LAB — POCT INR: INR: 3.1 — AB (ref 2.0–3.0)

## 2019-07-11 ENCOUNTER — Ambulatory Visit (INDEPENDENT_AMBULATORY_CARE_PROVIDER_SITE_OTHER): Payer: Medicaid Other | Admitting: Pharmacist

## 2019-07-11 ENCOUNTER — Other Ambulatory Visit: Payer: Self-pay

## 2019-07-11 DIAGNOSIS — Z952 Presence of prosthetic heart valve: Secondary | ICD-10-CM | POA: Diagnosis not present

## 2019-07-11 DIAGNOSIS — Z7901 Long term (current) use of anticoagulants: Secondary | ICD-10-CM

## 2019-07-11 LAB — POCT INR: INR: 3.1 — AB (ref 2.0–3.0)

## 2019-07-11 NOTE — Patient Instructions (Signed)
Description   Continue with 1 tablet daily except 1/2 tablet each Monday, Wednesday and Friday. Repeat INR in 6 weeks.      

## 2019-09-05 ENCOUNTER — Telehealth: Payer: Self-pay

## 2019-09-05 NOTE — Telephone Encounter (Signed)
lmomed for overdue inr 

## 2019-09-19 ENCOUNTER — Ambulatory Visit (INDEPENDENT_AMBULATORY_CARE_PROVIDER_SITE_OTHER): Payer: Medicaid Other

## 2019-09-19 ENCOUNTER — Other Ambulatory Visit: Payer: Self-pay

## 2019-09-19 DIAGNOSIS — Z952 Presence of prosthetic heart valve: Secondary | ICD-10-CM | POA: Diagnosis not present

## 2019-09-19 DIAGNOSIS — Z7901 Long term (current) use of anticoagulants: Secondary | ICD-10-CM | POA: Diagnosis not present

## 2019-09-19 LAB — POCT INR: INR: 3.4 — AB (ref 2.0–3.0)

## 2019-09-19 NOTE — Patient Instructions (Signed)
Continue with 1 tablet daily except 1/2 tablet each Monday, Wednesday and Friday. Repeat INR in 6 weeks.   

## 2019-10-31 ENCOUNTER — Telehealth: Payer: Self-pay

## 2019-10-31 NOTE — Telephone Encounter (Signed)
lmom for overdue inr 

## 2019-11-02 ENCOUNTER — Other Ambulatory Visit: Payer: Self-pay

## 2019-11-02 ENCOUNTER — Emergency Department (HOSPITAL_COMMUNITY)
Admission: EM | Admit: 2019-11-02 | Discharge: 2019-11-02 | Disposition: A | Discharge status: Home / Self Care | Payer: Medicaid Other | Attending: Emergency Medicine | Admitting: Emergency Medicine

## 2019-11-02 ENCOUNTER — Encounter (HOSPITAL_COMMUNITY): Payer: Self-pay

## 2019-11-02 ENCOUNTER — Emergency Department (HOSPITAL_COMMUNITY): Payer: Medicaid Other

## 2019-11-02 DIAGNOSIS — Z79899 Other long term (current) drug therapy: Secondary | ICD-10-CM | POA: Insufficient documentation

## 2019-11-02 DIAGNOSIS — R110 Nausea: Secondary | ICD-10-CM | POA: Insufficient documentation

## 2019-11-02 DIAGNOSIS — U071 COVID-19: Secondary | ICD-10-CM

## 2019-11-02 DIAGNOSIS — I5032 Chronic diastolic (congestive) heart failure: Secondary | ICD-10-CM | POA: Diagnosis not present

## 2019-11-02 DIAGNOSIS — R5381 Other malaise: Secondary | ICD-10-CM | POA: Diagnosis not present

## 2019-11-02 DIAGNOSIS — R5383 Other fatigue: Secondary | ICD-10-CM | POA: Diagnosis not present

## 2019-11-02 LAB — URINALYSIS, ROUTINE W REFLEX MICROSCOPIC
Bilirubin Urine: NEGATIVE
Glucose, UA: NEGATIVE mg/dL
Ketones, ur: NEGATIVE mg/dL
Leukocytes,Ua: NEGATIVE
Nitrite: POSITIVE — AB
Protein, ur: NEGATIVE mg/dL
Specific Gravity, Urine: 1.004 — ABNORMAL LOW (ref 1.005–1.030)
pH: 7 (ref 5.0–8.0)

## 2019-11-02 LAB — CBC WITH DIFFERENTIAL/PLATELET
Abs Immature Granulocytes: 0.02 10*3/uL (ref 0.00–0.07)
Basophils Absolute: 0 10*3/uL (ref 0.0–0.1)
Basophils Relative: 0 %
Eosinophils Absolute: 0 10*3/uL (ref 0.0–0.5)
Eosinophils Relative: 0 %
HCT: 38.4 % (ref 36.0–46.0)
Hemoglobin: 12.3 g/dL (ref 12.0–15.0)
Immature Granulocytes: 0 %
Lymphocytes Relative: 18 %
Lymphs Abs: 1.6 10*3/uL (ref 0.7–4.0)
MCH: 29.3 pg (ref 26.0–34.0)
MCHC: 32 g/dL (ref 30.0–36.0)
MCV: 91.4 fL (ref 80.0–100.0)
Monocytes Absolute: 0.5 10*3/uL (ref 0.1–1.0)
Monocytes Relative: 6 %
Neutro Abs: 6.6 10*3/uL (ref 1.7–7.7)
Neutrophils Relative %: 76 %
Platelets: 228 10*3/uL (ref 150–400)
RBC: 4.2 MIL/uL (ref 3.87–5.11)
RDW: 14.1 % (ref 11.5–15.5)
WBC: 8.7 10*3/uL (ref 4.0–10.5)
nRBC: 0 % (ref 0.0–0.2)

## 2019-11-02 LAB — COMPREHENSIVE METABOLIC PANEL
ALT: 15 U/L (ref 0–44)
AST: 28 U/L (ref 15–41)
Albumin: 3.7 g/dL (ref 3.5–5.0)
Alkaline Phosphatase: 63 U/L (ref 38–126)
Anion gap: 7 (ref 5–15)
BUN: 10 mg/dL (ref 6–20)
CO2: 24 mmol/L (ref 22–32)
Calcium: 8.6 mg/dL — ABNORMAL LOW (ref 8.9–10.3)
Chloride: 105 mmol/L (ref 98–111)
Creatinine, Ser: 1.06 mg/dL — ABNORMAL HIGH (ref 0.44–1.00)
GFR calc Af Amer: >60 mL/min (ref >60)
GFR calc non Af Amer: 60 mL/min — ABNORMAL LOW (ref >60)
Glucose, Bld: 131 mg/dL — ABNORMAL HIGH (ref 70–99)
Potassium: 3.6 mmol/L (ref 3.5–5.1)
Sodium: 136 mmol/L (ref 135–145)
Total Bilirubin: 0.6 mg/dL (ref 0.3–1.2)
Total Protein: 8 g/dL (ref 6.5–8.1)

## 2019-11-02 LAB — HCG, QUANTITATIVE, PREGNANCY: hCG, Beta Chain, Quant, S: 1 m[IU]/mL (ref <5)

## 2019-11-02 LAB — PROTIME-INR
INR: 1.6 — ABNORMAL HIGH (ref 0.8–1.2)
Prothrombin Time: 18.2 seconds — ABNORMAL HIGH (ref 11.4–15.2)

## 2019-11-02 LAB — SARS CORONAVIRUS 2 BY RT PCR (HOSPITAL ORDER, PERFORMED IN ~~LOC~~ HOSPITAL LAB): SARS Coronavirus 2: POSITIVE — AB

## 2019-11-02 LAB — LIPASE, BLOOD: Lipase: 21 U/L (ref 11–51)

## 2019-11-02 MED ORDER — ALBUTEROL SULFATE HFA 108 (90 BASE) MCG/ACT IN AERS: 2.0000 | INHALATION_SPRAY | Freq: Once | RESPIRATORY_TRACT | Status: DC | PRN

## 2019-11-02 MED ORDER — DIPHENHYDRAMINE HCL 50 MG/ML IJ SOLN: 50.0000 mg | Freq: Once | INTRAMUSCULAR | Status: DC | PRN

## 2019-11-02 MED ORDER — SODIUM CHLORIDE 0.9 % IV SOLN
1200.0000 mg | Freq: Once | INTRAVENOUS | Status: DC
  Filled 2019-11-02: qty 10

## 2019-11-02 MED ORDER — SODIUM CHLORIDE 0.9 % IV SOLN: INTRAVENOUS | Status: DC | PRN

## 2019-11-02 MED ORDER — METHYLPREDNISOLONE SODIUM SUCC 125 MG IJ SOLR: 125.0000 mg | Freq: Once | INTRAMUSCULAR | Status: DC | PRN

## 2019-11-02 MED ORDER — SODIUM CHLORIDE 0.9 % IV SOLN
1000.0000 mL | INTRAVENOUS | Status: DC
  Administered 2019-11-02: 1000 mL via INTRAVENOUS

## 2019-11-02 MED ORDER — SODIUM CHLORIDE 0.9 % IV BOLUS (SEPSIS)
1000.0000 mL | Freq: Once | INTRAVENOUS | Status: AC
  Administered 2019-11-02: 1000 mL via INTRAVENOUS

## 2019-11-02 MED ORDER — EPINEPHRINE 0.3 MG/0.3ML IJ SOAJ: 0.3000 mg | Freq: Once | INTRAMUSCULAR | Status: DC | PRN

## 2019-11-02 MED ORDER — FAMOTIDINE IN NACL 20-0.9 MG/50ML-% IV SOLN: 20.0000 mg | Freq: Once | INTRAVENOUS | Status: DC | PRN

## 2019-11-02 NOTE — ED Triage Notes (Signed)
Per EMS pt has been having diarrhea x3 days with Nausea. Positive Ortho changes with BP.

## 2019-11-02 NOTE — ED Notes (Signed)
Pt decided she does not want MAB, dr. Tomi Bamberger made aware of the same

## 2019-11-02 NOTE — ED Notes (Signed)
D/c instructions reviewed with patient, verbalized understanding of the same.

## 2019-11-02 NOTE — ED Provider Notes (Signed)
[facility] DEPT Provider Note   CSN: [id number] Arrival date & time: 11/02/19  1544     History Chief Complaint  Patient presents with  . Fatigue    Tonya Mcintosh is a 54 y.o. female.  HPI   Patient presents to the emergency room for evaluation of fatigue.  Patient states she started having symptoms a few days ago.  She started having generalized malaise.  Patient also started to have nausea but has not had any vomiting.  Patient had one loose stool today but no diarrhea.  She denies any trouble with fevers although she has not taken her temperature.  She has not had any abdominal pain.  No chest pain.  She has been coughing.  Patient states she did note some blood-tinged sputum.  She is on Coumadin and has been taking it regularly.  No dysuria.  Today her symptoms felt worse so she called EMS and was brought to the ED.  Past Medical History:  Diagnosis Date  . Abnormal vaginal Pap smear    CIN I  . Aortic valve replaced 1987; September 2010  . Endocarditis 2006   Treated w/ antibiotics long term, doing well since.  . H/O insomnia   . Hypercholesterolemia   . Hyperlipidemia   . Kidney calculi   . Kidney infection   . VSD (ventricular septal defect) 1979   repair    Patient Active Problem List   Diagnosis Date Noted  . Cervical high risk HPV (human papillomavirus) test positive 04/08/2016  . Hot flashes due to menopause 03/24/2016  . Chronic diastolic heart failure, NYHA class 1 ([place]) 06/30/2014  . Heart palpitations 06/30/2014  . S/P aortic valve replacement 02/03/2013  . Discomfort of chest wall 02/03/2013  . Long term current use of anticoagulant therapy 05/11/2012  . INSOMNIA 05/01/2009  . AORTIC VALVE REPLACEMENT, HX OF 03/28/2009  . FIBROIDS, UTERUS 02/19/2008  . Dyslipidemia 02/09/2008  . ABD/PELVIC SWELLING MASS/LUMP OTH [facility] SITE 02/09/2008    Past Surgical History:  Procedure Laterality Date  . AORTIC VALVE REPLACEMENT   07/17/1985   23-mm Medtronic Hall prosthesis  . CARDIAC CATHETERIZATION  01/25/1985   Preop 10 2010, no significant CAD.  Marland Kitchen CARDIOVASCULAR STRESS TEST  09/22/2008   Fixed defect at the apex. No stress induced ischemia.  Marland Kitchen MECHANICAL AORTIC VALVE REPLACEMENT  October 2010   Medtronic prosthetic valve exchanged for a St. Jude tactile prosthesis (bileaflet)  . TRANSTHORACIC ECHOCARDIOGRAM  12/25/2008   EF >55%, doppler evidence of regurg is probably normal of St Jude prosthetic aortic valve.  . TRANSTHORACIC ECHOCARDIOGRAM  03/19/2013   EF 60-65%; ? Grade 3 diastolic dysfunction; Mech Prosthesis of AoV -mild-mod AI. Mildly increased gradient.  . TUBAL LIGATION  1986  . VSD REPAIR  02-22-77     OB History   No obstetric history on file.     Family History  Problem Relation Age of Onset  . Asthma Sister     Social History   Tobacco Use  . Smoking status: Never Smoker  . Smokeless tobacco: Never Used  Substance Use Topics  . Alcohol use: No  . Drug use: No    Home Medications Prior to Admission medications   Medication Sig Start Date End Date Taking? Authorizing Provider  acetaminophen (TYLENOL) 500 MG tablet Take 1 tablet (500 mg total) by mouth every 6 (six) hours as needed for pain. Patient taking differently: Take 1,000 mg by mouth every 6 (six) hours as needed for  pain.  10/15/12   Domenic Moras, PA-C  traMADol-acetaminophen (ULTRACET) 37.5-325 MG tablet Take 1 tablet by mouth every 6 (six) hours as needed.    [provider]  warfarin (COUMADIN) 3 MG tablet TAKE 1/2 TO 1 TABLET BY MOUTH DAILY AS DIRECTED 02/05/19   Leonie Man, MD    Allergies    Codeine  Review of Systems   Review of Systems  All other systems reviewed and are negative.   Physical Exam Updated Vital Signs BP 126/85 (BP Location: Right Arm)   Pulse 88   Temp 99.9 F (37.7 C)   Resp 18   Ht 1.6 m (5\' 3" )   Wt 70.3 kg   SpO2 98%   BMI 27.46 kg/m   Physical Exam Vitals and nursing  note reviewed.  Constitutional:      General: She is not in acute distress.    Appearance: She is well-developed.  HENT:     Head: Normocephalic and atraumatic.     Right Ear: External ear normal.     Left Ear: External ear normal.  Eyes:     General: No scleral icterus.       Right eye: No discharge.        Left eye: No discharge.     Conjunctiva/sclera: Conjunctivae normal.  Neck:     Trachea: No tracheal deviation.  Cardiovascular:     Rate and Rhythm: Normal rate and regular rhythm.     Comments: Median sternotomy scar Pulmonary:     Effort: Pulmonary effort is normal. No respiratory distress.     Breath sounds: Normal breath sounds. No stridor. No wheezing or rales.  Abdominal:     General: Bowel sounds are normal. There is no distension.     Palpations: Abdomen is soft.     Tenderness: There is no abdominal tenderness. There is no guarding or rebound.  Musculoskeletal:        General: No tenderness.     Cervical back: Neck supple.  Skin:    General: Skin is warm and dry.     Findings: No rash.  Neurological:     Mental Status: She is alert.     Cranial Nerves: No cranial nerve deficit (no facial droop, extraocular movements intact, no slurred speech).     Sensory: No sensory deficit.     Motor: No abnormal muscle tone or seizure activity.     Coordination: Coordination normal.     ED Results / Procedures / Treatments   Labs (all labs ordered are listed, but only abnormal results are displayed) Labs Reviewed  SARS CORONAVIRUS 2 BY RT PCR ([facility]) - Abnormal; Notable for the following components:      Result Value   SARS Coronavirus 2 POSITIVE (*)    All other components within normal limits  COMPREHENSIVE METABOLIC PANEL - Abnormal; Notable for the following components:   Glucose, Bld 131 (*)    Creatinine, Ser 1.06 (*)    Calcium 8.6 (*)    GFR calc non Af Amer 60 (*)    All other components within normal limits   URINALYSIS, ROUTINE W REFLEX MICROSCOPIC - Abnormal; Notable for the following components:   Color, Urine STRAW (*)    APPearance HAZY (*)    Specific Gravity, Urine 1.004 (*)    Hgb urine dipstick MODERATE (*)    Nitrite POSITIVE (*)    Bacteria, UA MANY (*)    All other components  within normal limits  PROTIME-INR - Abnormal; Notable for the following components:   Prothrombin Time 18.2 (*)    INR 1.6 (*)    All other components within normal limits  CBC WITH DIFFERENTIAL/PLATELET  LIPASE, BLOOD  HCG, QUANTITATIVE, PREGNANCY  I-STAT BETA HCG BLOOD, ED ([facility], [facility], [facility] ONLY)    EKG None  Radiology DG Chest Portable 1 View  Result Date: 11/02/2019 CLINICAL DATA:  Weakness. EXAM: PORTABLE CHEST 1 VIEW COMPARISON:  Jul 09, 2015 FINDINGS: The patient is status post prior median sternotomy and valve replacement. The heart size is mildly enlarged. There is mild vascular congestion without overt pulmonary edema. There may be a small left-sided pleural effusion. There is no pneumothorax. An azygos lobe is noted. There is no acute osseous abnormality. IMPRESSION: Cardiomegaly with mild pulmonary vascular congestion and possible small left pleural effusion. Electronically Signed   By: Constance Holster M.D.   On: 11/02/2019 16:26    Procedures Procedures (including critical care time)  Medications Ordered in ED Medications  sodium chloride 0.9 % bolus 1,000 mL (0 mLs Intravenous Stopped 11/02/19 1745)    Followed by  0.9 %  sodium chloride infusion (0 mLs Intravenous Stopped 11/02/19 1944)  casirivimab-imdevimab (REGEN-COV) 1,200 mg in sodium chloride 0.9 % 110 mL IVPB (1,200 mg Intravenous Refused 11/02/19 1944)  0.9 %  sodium chloride infusion (has no administration in time range)  diphenhydrAMINE (BENADRYL) injection 50 mg (has no administration in time range)  famotidine (PEPCID) IVPB 20 mg premix (has no administration in time range)  methylPREDNISolone sodium succinate (SOLU-MEDROL)  125 mg/2 mL injection 125 mg (has no administration in time range)  albuterol (VENTOLIN HFA) 108 (90 Base) MCG/ACT inhaler 2 puff (has no administration in time range)  EPINEPHrine (EPI-PEN) injection 0.3 mg (has no administration in time range)    ED Course  I have reviewed the triage vital signs and the nursing notes.  Pertinent labs & imaging results that were available during my care of the patient were reviewed by me and considered in my medical decision making (see chart for details).  Clinical Course as of Nov 02 1954  Fri Nov 02, 2019  1718 Chest x-ray with cardiomegaly and possible mild vascular congestion   [JK]  4034 CBC metabolic panel unremarkable   [JK]  1746 Labs are notable for positive Covid test.   [JK]  1951 Pt was getting ready to start the MAB infusion.  SHe has now decided she does not want it done.   [JK]    Clinical Course User Index [JK] Dorie Rank, MD   MDM Rules/Calculators/A&P                         Tonya Mcintosh was evaluated in Emergency Department on 11/02/2019 for the symptoms described in the history of present illness. She was evaluated in the context of the global COVID-19 pandemic, which necessitated consideration that the patient might be at risk for infection with the SARS-CoV-2 virus that causes COVID-19. Institutional protocols and algorithms that pertain to the evaluation of patients at risk for COVID-19 are in a state of rapid change based on information released by regulatory bodies including the CDC and federal and state organizations. These policies and algorithms were followed during the patient's care in the ED.  Patient presents ED with complaints of fatigue.  She has been coughing.  Chest x-ray does not show definite pneumonia.  Patient is oxygenating normally.  Her Covid  test is positive.  This certainly accounts for her symptoms.  Fortunately no indication for hospitalization at this time.  However the patient does have several risk  factors for severe COVID-19 disease.  She has had valvular heart surgery.  I will order monoclonal antibody infusion.  7:56 PM Pt decided she does not want the infusion now.  Will dc at this point.  Cautioned pt to monitor for worsening shortness of breath.  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

## 2019-11-02 NOTE — Discharge Instructions (Addendum)
Contact your doctor if you change your mind about the monoclonal antibody infusion.  It can be arranged as an outpatient.  Take tylenol as needed for fever.  Return to the ED for worsening shortness of breath.  Stay isolated/quarantined for the next 10 days.

## 2019-11-07 ENCOUNTER — Telehealth: Payer: Self-pay

## 2019-11-07 NOTE — Telephone Encounter (Signed)
lmomed for overdue inr 

## 2019-11-15 ENCOUNTER — Telehealth: Payer: Self-pay

## 2019-11-15 NOTE — Telephone Encounter (Signed)
lmom for overdue inr 

## 2019-12-12 ENCOUNTER — Ambulatory Visit (INDEPENDENT_AMBULATORY_CARE_PROVIDER_SITE_OTHER): Payer: Medicaid Other

## 2019-12-12 ENCOUNTER — Other Ambulatory Visit: Payer: Self-pay

## 2019-12-12 DIAGNOSIS — Z952 Presence of prosthetic heart valve: Secondary | ICD-10-CM

## 2019-12-12 DIAGNOSIS — Z7901 Long term (current) use of anticoagulants: Secondary | ICD-10-CM | POA: Diagnosis not present

## 2019-12-12 DIAGNOSIS — I4891 Unspecified atrial fibrillation: Secondary | ICD-10-CM | POA: Insufficient documentation

## 2019-12-12 LAB — POCT INR: INR: 3.8 — AB (ref 2.0–3.0)

## 2019-12-12 NOTE — Patient Instructions (Addendum)
Take 1 tablet daily except 0.5 tablet Monday, Wednesday and Friday.  Eat more greens over the next 2 days.  Repeat INR 2 weeks

## 2019-12-27 ENCOUNTER — Telehealth: Payer: Self-pay

## 2019-12-27 NOTE — Telephone Encounter (Signed)
Called and lmomed the pt that they are overdue for inr check

## 2020-01-07 ENCOUNTER — Ambulatory Visit (INDEPENDENT_AMBULATORY_CARE_PROVIDER_SITE_OTHER): Payer: Medicaid Other

## 2020-01-07 DIAGNOSIS — Z7901 Long term (current) use of anticoagulants: Secondary | ICD-10-CM | POA: Diagnosis not present

## 2020-01-07 DIAGNOSIS — Z952 Presence of prosthetic heart valve: Secondary | ICD-10-CM

## 2020-01-07 LAB — POCT INR: INR: 3 (ref 2.0–3.0)

## 2020-01-07 NOTE — Patient Instructions (Signed)
Take 1 tablet daily except 0.5 tablet Monday, Wednesday and Friday.   Repeat INR 4 weeks   Please use code [phone]

## 2020-01-21 NOTE — Progress Notes (Deleted)
Office Visit Note  Patient: Tonya Mcintosh             Date of Birth: 03-15-1965           MRN: [id number]             PCP: Pcp, No Referring: Sherene Sires, DO Visit Date: 02/04/2020 Occupation: '@GUAROCC' @  Subjective:  No chief complaint on file.   History of Present Illness: Tonya Mcintosh is a 54 y.o. female ***   Activities of Daily Living:  Patient reports morning stiffness for *** {minute/hour:19697}.   Patient {ACTIONS;DENIES/REPORTS:21021675::"Denies"} nocturnal pain.  Difficulty dressing/grooming: {ACTIONS;DENIES/REPORTS:21021675::"Denies"} Difficulty climbing stairs: {ACTIONS;DENIES/REPORTS:21021675::"Denies"} Difficulty getting out of chair: {ACTIONS;DENIES/REPORTS:21021675::"Denies"} Difficulty using hands for taps, buttons, cutlery, and/or writing: {ACTIONS;DENIES/REPORTS:21021675::"Denies"}  No Rheumatology ROS completed.   PMFS History:  Patient Active Problem List   Diagnosis Date Noted  . Atrial fibrillation ([place]) 12/12/2019  . Long term (current) use of anticoagulants 12/12/2019  . Cervical high risk HPV (human papillomavirus) test positive 04/08/2016  . Hot flashes due to menopause 03/24/2016  . Chronic diastolic heart failure, NYHA class 1 ([place]) 06/30/2014  . Heart palpitations 06/30/2014  . S/P aortic valve replacement 02/03/2013  . Discomfort of chest wall 02/03/2013  . INSOMNIA 05/01/2009  . AORTIC VALVE REPLACEMENT, HX OF 03/28/2009  . FIBROIDS, UTERUS 02/19/2008  . Dyslipidemia 02/09/2008  . ABD/PELVIC SWELLING MASS/LUMP OTH [facility] SITE 02/09/2008    Past Medical History:  Diagnosis Date  . Abnormal vaginal Pap smear    CIN I  . Aortic valve replaced 1987; September 2010  . Endocarditis 2006   Treated w/ antibiotics long term, doing well since.  . H/O insomnia   . Hypercholesterolemia   . Hyperlipidemia   . Kidney calculi   . Kidney infection   . VSD (ventricular septal defect) 1979   repair    Family History  Problem Relation Age  of Onset  . Asthma Sister    Past Surgical History:  Procedure Laterality Date  . AORTIC VALVE REPLACEMENT  07/17/1985   23-mm Medtronic Hall prosthesis  . CARDIAC CATHETERIZATION  01/25/1985   Preop 10 2010, no significant CAD.  Marland Kitchen CARDIOVASCULAR STRESS TEST  09/22/2008   Fixed defect at the apex. No stress induced ischemia.  Marland Kitchen MECHANICAL AORTIC VALVE REPLACEMENT  October 2010   Medtronic prosthetic valve exchanged for a St. Jude tactile prosthesis (bileaflet)  . TRANSTHORACIC ECHOCARDIOGRAM  12/25/2008   EF >55%, doppler evidence of regurg is probably normal of St Jude prosthetic aortic valve.  . TRANSTHORACIC ECHOCARDIOGRAM  03/19/2013   EF 60-65%; ? Grade 3 diastolic dysfunction; Mech Prosthesis of AoV -mild-mod AI. Mildly increased gradient.  . TUBAL LIGATION  1986  . VSD REPAIR  02-22-77   Social History   Social History Narrative   Lives with ex-husband and children in [place]. No smoking, no etoh, disability   Immunization History  Administered Date(s) Administered  . PPD Test 04/05/2016  . Td 04/10/2003     Objective: Vital Signs: There were no vitals taken for this visit.   Physical Exam   Musculoskeletal Exam: ***  CDAI Exam: CDAI Score: -- Patient Global: --; Provider Global: -- Swollen: --; Tender: -- Joint Exam 02/04/2020   No joint exam has been documented for this visit   There is currently no information documented on the homunculus. Go to the Rheumatology activity and complete the homunculus joint exam.  Investigation: No additional findings.  Imaging: No results found.  Recent Labs: Lab Results  Component Value Date   WBC 8.7 11/02/2019   HGB 12.3 11/02/2019   PLT 228 11/02/2019   NA 136 11/02/2019   K 3.6 11/02/2019   CL 105 11/02/2019   CO2 24 11/02/2019   GLUCOSE 131 (H) 11/02/2019   BUN 10 11/02/2019   CREATININE 1.06 (H) 11/02/2019   BILITOT 0.6 11/02/2019   ALKPHOS 63 11/02/2019   AST 28 11/02/2019   ALT 15 11/02/2019   PROT 8.0  11/02/2019   ALBUMIN 3.7 11/02/2019   CALCIUM 8.6 (L) 11/02/2019   GFRAA >60 11/02/2019    Speciality Comments: No specialty comments available.  Procedures:  No procedures performed Allergies: Codeine   Assessment / Plan:     Visit Diagnoses: Positive ANA (antinuclear antibody) - 07/24/19: ANA+, dsDNA2, RNP-, Smith-, Ro 1.3, La-, RF<7, CCP 5, uric acid 4.7, ESR 33,  DDD (degenerative disc disease), lumbosacral  Vitamin D deficiency  Neuropathy  Chronic pain syndrome  History of hypothyroidism  Essential hypertension  Chronic diastolic heart failure, NYHA class 1 (H[facility])  Atrial fibrillation, unspecified type ([place])  Long term (current) use of anticoagulants - on warfarin  Hot flashes due to menopause  Dyslipidemia  S/P aortic valve replacement  Cervical high risk HPV (human papillomavirus) test positive  Orders: No orders of the defined types were placed in this encounter.  No orders of the defined types were placed in this encounter.   Face-to-face time spent with patient was *** minutes. Greater than 50% of time was spent in counseling and coordination of care.  Follow-Up Instructions: No follow-ups on file.   Ofilia Neas, PA-C  Note - This record has been created using Dragon software.  Chart creation errors have been sought, but may not always  have been located. Such creation errors do not reflect on  the standard of medical care.

## 2020-02-04 ENCOUNTER — Ambulatory Visit: Payer: Medicaid Other | Admitting: Rheumatology

## 2020-02-13 ENCOUNTER — Telehealth: Payer: Self-pay

## 2020-02-13 NOTE — Telephone Encounter (Signed)
lmom for overdue inr 

## 2020-02-18 ENCOUNTER — Telehealth: Payer: Self-pay

## 2020-02-18 NOTE — Telephone Encounter (Signed)
lmom for overdue inr 

## 2020-02-28 ENCOUNTER — Ambulatory Visit: Payer: Medicaid Other | Admitting: Rheumatology

## 2020-03-12 ENCOUNTER — Telehealth: Payer: Self-pay

## 2020-03-12 NOTE — Telephone Encounter (Signed)
lmom for overdue inr final attempt

## 2020-03-21 ENCOUNTER — Ambulatory Visit (INDEPENDENT_AMBULATORY_CARE_PROVIDER_SITE_OTHER): Payer: Medicaid Other

## 2020-03-21 ENCOUNTER — Other Ambulatory Visit: Payer: Self-pay

## 2020-03-21 DIAGNOSIS — Z952 Presence of prosthetic heart valve: Secondary | ICD-10-CM

## 2020-03-21 DIAGNOSIS — Z7901 Long term (current) use of anticoagulants: Secondary | ICD-10-CM

## 2020-03-21 DIAGNOSIS — Z5181 Encounter for therapeutic drug level monitoring: Secondary | ICD-10-CM

## 2020-03-21 DIAGNOSIS — I824Y9 Acute embolism and thrombosis of unspecified deep veins of unspecified proximal lower extremity: Secondary | ICD-10-CM | POA: Insufficient documentation

## 2020-03-21 LAB — POCT INR: INR: 2.8 (ref 2.0–3.0)

## 2020-03-21 NOTE — Patient Instructions (Signed)
Take 1 tablet Daily except 0.5 tablet on Monday, Wednesday and Friday.  INR in 6 weeks

## 2020-05-09 ENCOUNTER — Other Ambulatory Visit: Payer: Self-pay

## 2020-05-09 ENCOUNTER — Ambulatory Visit (INDEPENDENT_AMBULATORY_CARE_PROVIDER_SITE_OTHER): Payer: Medicaid Other

## 2020-05-09 DIAGNOSIS — Z5181 Encounter for therapeutic drug level monitoring: Secondary | ICD-10-CM

## 2020-05-09 DIAGNOSIS — Z7901 Long term (current) use of anticoagulants: Secondary | ICD-10-CM | POA: Diagnosis not present

## 2020-05-09 DIAGNOSIS — Z952 Presence of prosthetic heart valve: Secondary | ICD-10-CM | POA: Diagnosis not present

## 2020-05-09 LAB — POCT INR: INR: 3 (ref 2.0–3.0)

## 2020-05-09 NOTE — Patient Instructions (Signed)
Take 1 tablet Daily except 0.5 tablet on Monday, Wednesday and Friday.  INR in 6 weeks

## 2020-06-20 ENCOUNTER — Ambulatory Visit (INDEPENDENT_AMBULATORY_CARE_PROVIDER_SITE_OTHER): Payer: Medicaid Other

## 2020-06-20 ENCOUNTER — Other Ambulatory Visit: Payer: Self-pay

## 2020-06-20 DIAGNOSIS — Z5181 Encounter for therapeutic drug level monitoring: Secondary | ICD-10-CM

## 2020-06-20 DIAGNOSIS — Z7901 Long term (current) use of anticoagulants: Secondary | ICD-10-CM

## 2020-06-20 DIAGNOSIS — Z952 Presence of prosthetic heart valve: Secondary | ICD-10-CM | POA: Diagnosis not present

## 2020-06-20 LAB — POCT INR: INR: 3.2 — AB (ref 2.0–3.0)

## 2020-06-20 NOTE — Patient Instructions (Signed)
Take 1 tablet Daily except 0.5 tablet on Monday, Wednesday and Friday.  INR in 6 weeks

## 2020-08-04 ENCOUNTER — Telehealth: Payer: Self-pay

## 2020-08-04 MED ORDER — WARFARIN SODIUM 3 MG PO TABS: ORAL_TABLET | ORAL | 0 refills | Status: DC

## 2020-08-04 NOTE — Telephone Encounter (Signed)
WARFARIN REFILLED

## 2020-08-04 NOTE — Telephone Encounter (Signed)
*  STAT* If patient is at the pharmacy, call can be transferred to refill team.   1. Which medications need to be refilled? (please list name of each medication and dose if known)  warfarin (COUMADIN) 3 MG tablet  2. Which pharmacy/location (including street and city if local pharmacy) is medication to be sent to? [id number] - [place], [place] - [address] AT [place]  3. Do they need a 30 day or 90 day supply?  30 day supply  Patient's daughter states the patient took her last tablet yesterday.

## 2020-08-08 ENCOUNTER — Telehealth: Payer: Self-pay | Admitting: Cardiology

## 2020-08-08 ENCOUNTER — Ambulatory Visit (INDEPENDENT_AMBULATORY_CARE_PROVIDER_SITE_OTHER): Payer: Medicaid Other

## 2020-08-08 ENCOUNTER — Other Ambulatory Visit: Payer: Self-pay

## 2020-08-08 DIAGNOSIS — Z7901 Long term (current) use of anticoagulants: Secondary | ICD-10-CM | POA: Diagnosis not present

## 2020-08-08 DIAGNOSIS — Z5181 Encounter for therapeutic drug level monitoring: Secondary | ICD-10-CM

## 2020-08-08 DIAGNOSIS — Z952 Presence of prosthetic heart valve: Secondary | ICD-10-CM

## 2020-08-08 LAB — POCT INR: INR: 3.1 — AB (ref 2.0–3.0)

## 2020-08-08 NOTE — Patient Instructions (Signed)
Take 1 tablet Daily except 0.5 tablet on Monday, Wednesday and Friday.  INR in 6 weeks

## 2020-08-08 NOTE — Telephone Encounter (Signed)
Patient was in office for INR check, nurse brought patient to check out to schedule appointment with provider, patient was last seen in 2016. Patient requested to switch from [place], and to start seeing Dr.Acharya.  Message sent to both providers. Waiting on response, then will contact patient to schedule appointment.

## 2020-09-22 ENCOUNTER — Ambulatory Visit (INDEPENDENT_AMBULATORY_CARE_PROVIDER_SITE_OTHER): Payer: Medicaid Other

## 2020-09-22 ENCOUNTER — Other Ambulatory Visit: Payer: Self-pay

## 2020-09-22 DIAGNOSIS — Z5181 Encounter for therapeutic drug level monitoring: Secondary | ICD-10-CM

## 2020-09-22 DIAGNOSIS — Z952 Presence of prosthetic heart valve: Secondary | ICD-10-CM | POA: Diagnosis not present

## 2020-09-22 DIAGNOSIS — Z7901 Long term (current) use of anticoagulants: Secondary | ICD-10-CM | POA: Diagnosis not present

## 2020-09-22 LAB — POCT INR: INR: 3.3 — AB (ref 2.0–3.0)

## 2020-09-22 NOTE — Patient Instructions (Signed)
Take 1 tablet Daily except 0.5 tablet on Monday, Wednesday and Friday.  INR in 6 weeks

## 2020-10-20 ENCOUNTER — Other Ambulatory Visit: Payer: Self-pay | Admitting: Cardiology

## 2020-11-03 ENCOUNTER — Telehealth: Payer: Self-pay | Admitting: Cardiology

## 2020-11-03 NOTE — Telephone Encounter (Signed)
*  STAT* If patient is at the pharmacy, call can be transferred to refill team.   1. Which medications need to be refilled? (please list name of each medication and dose if known) warfarin (COUMADIN) 3 MG tablet  2. Which pharmacy/location (including street and city if local pharmacy) is medication to be sent to? [id number] [place], [place] - [address]. Do they need a 30 day or 90 day supply?   Pt called to reschedule appt til 11/06/20, appt was today at 3:45pm but she cancelled

## 2020-11-04 NOTE — Telephone Encounter (Signed)
HOLD REFILL UNTIL SEEN APPT 11/07/20

## 2020-11-07 ENCOUNTER — Ambulatory Visit (INDEPENDENT_AMBULATORY_CARE_PROVIDER_SITE_OTHER): Payer: Medicaid Other

## 2020-11-07 ENCOUNTER — Other Ambulatory Visit: Payer: Self-pay

## 2020-11-07 DIAGNOSIS — Z5181 Encounter for therapeutic drug level monitoring: Secondary | ICD-10-CM

## 2020-11-07 DIAGNOSIS — Z7901 Long term (current) use of anticoagulants: Secondary | ICD-10-CM

## 2020-11-07 DIAGNOSIS — Z952 Presence of prosthetic heart valve: Secondary | ICD-10-CM | POA: Diagnosis not present

## 2020-11-07 LAB — POCT INR: INR: 2.9 (ref 2.0–3.0)

## 2020-11-07 MED ORDER — WARFARIN SODIUM 3 MG PO TABS: ORAL_TABLET | ORAL | 1 refills | Status: DC

## 2020-11-07 NOTE — Patient Instructions (Signed)
Take 1 tablet Daily except 0.5 tablet on Monday, Wednesday and Friday.  INR in 6 weeks

## 2020-11-10 NOTE — Telephone Encounter (Signed)
Patient daughter Lars Mage was contacted regarding mother requesting provider switch from [place] to [place]. Daughter was informed that patient will need to stay  with Dr.Harding at this time.

## 2020-11-29 ENCOUNTER — Other Ambulatory Visit: Payer: Self-pay | Admitting: Cardiology

## 2020-12-15 ENCOUNTER — Other Ambulatory Visit: Payer: Self-pay | Admitting: Cardiology

## 2020-12-15 NOTE — Telephone Encounter (Signed)
Prescription refill request received for warfarin [place]: 06/28/2014 Next INR check: 10/28 Warfarin tablet strength: 3mg   Pt is overdue to see the cardiologist. Put on appointment note pt needs to schedule appointment with cardiologist.

## 2020-12-19 ENCOUNTER — Telehealth: Payer: Self-pay

## 2020-12-19 NOTE — Telephone Encounter (Signed)
Unable to lmom for missed appt to r/s

## 2021-01-01 ENCOUNTER — Telehealth: Payer: Self-pay

## 2021-01-01 NOTE — Telephone Encounter (Signed)
Lmom for overdue inr 

## 2021-01-07 ENCOUNTER — Other Ambulatory Visit: Payer: Self-pay

## 2021-01-07 ENCOUNTER — Telehealth: Payer: Self-pay

## 2021-01-07 ENCOUNTER — Ambulatory Visit (INDEPENDENT_AMBULATORY_CARE_PROVIDER_SITE_OTHER): Payer: Medicaid Other

## 2021-01-07 DIAGNOSIS — Z7901 Long term (current) use of anticoagulants: Secondary | ICD-10-CM | POA: Diagnosis not present

## 2021-01-07 DIAGNOSIS — Z5181 Encounter for therapeutic drug level monitoring: Secondary | ICD-10-CM

## 2021-01-07 DIAGNOSIS — Z952 Presence of prosthetic heart valve: Secondary | ICD-10-CM

## 2021-01-07 LAB — POCT INR: INR: 3 (ref 2.0–3.0)

## 2021-01-07 MED ORDER — WARFARIN SODIUM 3 MG PO TABS: ORAL_TABLET | ORAL | 0 refills | Status: DC

## 2021-01-07 NOTE — Patient Instructions (Signed)
Take 1 tablet Daily except 0.5 tablet on Monday, Wednesday and Friday.  INR in 6 weeks.  [phone]

## 2021-01-07 NOTE — Telephone Encounter (Signed)
Patient was in ofc and once again requested to be moved to [place] from [place].  Daughter would like to be called with some type of explanation as to why this is not being done.  03-09-20 VB

## 2021-01-09 NOTE — Telephone Encounter (Signed)
If the patient wants to switch from me to a different doctor.  I am fine with that.  I take no offense.  I think my partners are exceptional & patients will be well cared for.    Glenetta Hew, MD

## 2021-01-09 NOTE — Telephone Encounter (Signed)
Spoke with Patient Daughter, Lars Mage, patient would like to transfer to Dr. Harriet Masson knowing that Dr. Margaretann Loveless is going to be out of the office.  Told patient we would get her setup once Dr. Ellyn Hack released the patient.   Dr. Ellyn Hack we need your approval to release this patient from your service and Dr. Harriet Masson are you willing to accept this patient as a part of your service?  Once the patient has transferred services we will get her setup for a new patient appointment with Dr. Harriet Masson.

## 2021-01-12 NOTE — Telephone Encounter (Signed)
Left detailed message for patient daughter to call and schedule "new patient" appointment with Dr. Harriet Masson. She has CVRR appt at end of December, recommended that she try to schedule appointment with Dr. Harriet Masson before her next CVRR appt.

## 2021-02-02 ENCOUNTER — Ambulatory Visit: Payer: Medicaid Other | Admitting: Cardiology

## 2021-02-04 ENCOUNTER — Ambulatory Visit: Payer: Medicaid Other | Admitting: Cardiology

## 2021-02-11 ENCOUNTER — Other Ambulatory Visit: Payer: Self-pay | Admitting: Nurse Practitioner

## 2021-02-11 DIAGNOSIS — Z1231 Encounter for screening mammogram for malignant neoplasm of breast: Secondary | ICD-10-CM

## 2021-02-18 ENCOUNTER — Ambulatory Visit (INDEPENDENT_AMBULATORY_CARE_PROVIDER_SITE_OTHER): Payer: Medicaid Other

## 2021-02-18 ENCOUNTER — Other Ambulatory Visit: Payer: Self-pay

## 2021-02-18 DIAGNOSIS — Z952 Presence of prosthetic heart valve: Secondary | ICD-10-CM | POA: Diagnosis not present

## 2021-02-18 DIAGNOSIS — I4891 Unspecified atrial fibrillation: Secondary | ICD-10-CM

## 2021-02-18 DIAGNOSIS — Z7901 Long term (current) use of anticoagulants: Secondary | ICD-10-CM | POA: Diagnosis not present

## 2021-02-18 LAB — POCT INR: INR: 2.4 (ref 2.0–3.0)

## 2021-02-18 NOTE — Patient Instructions (Signed)
Description   Take 1 tablet today and then continue taking 1 tablet daily except 0.5 tablet on Monday, Wednesday and Friday.  INR in 6 weeks.  [phone]

## 2021-03-10 ENCOUNTER — Ambulatory Visit: Payer: Medicaid Other | Admitting: Cardiology

## 2021-03-10 ENCOUNTER — Other Ambulatory Visit: Payer: Self-pay | Admitting: Nurse Practitioner

## 2021-03-10 DIAGNOSIS — N6310 Unspecified lump in the right breast, unspecified quadrant: Secondary | ICD-10-CM

## 2021-04-03 ENCOUNTER — Telehealth: Payer: Self-pay

## 2021-04-03 NOTE — Telephone Encounter (Signed)
Lpmtcb and schedule INR check 

## 2021-04-10 ENCOUNTER — Inpatient Hospital Stay: Admission: RE | Admit: 2021-04-10 | Payer: Medicaid Other | Source: Ambulatory Visit

## 2021-04-23 ENCOUNTER — Other Ambulatory Visit: Payer: Self-pay

## 2021-04-23 ENCOUNTER — Ambulatory Visit (INDEPENDENT_AMBULATORY_CARE_PROVIDER_SITE_OTHER): Payer: Medicaid Other

## 2021-04-23 DIAGNOSIS — Z5181 Encounter for therapeutic drug level monitoring: Secondary | ICD-10-CM

## 2021-04-23 DIAGNOSIS — Z7901 Long term (current) use of anticoagulants: Secondary | ICD-10-CM | POA: Diagnosis not present

## 2021-04-23 DIAGNOSIS — Z952 Presence of prosthetic heart valve: Secondary | ICD-10-CM | POA: Diagnosis not present

## 2021-04-23 LAB — POCT INR: INR: 3.1 — AB (ref 2.0–3.0)

## 2021-04-23 NOTE — Patient Instructions (Signed)
continue taking 1 tablet daily except 0.5 tablet on Monday, Wednesday and Friday.  INR in 6 weeks.  [phone] ?

## 2021-06-04 ENCOUNTER — Ambulatory Visit: Payer: Medicaid Other | Admitting: Cardiology

## 2021-06-11 ENCOUNTER — Telehealth: Payer: Self-pay

## 2021-06-11 NOTE — Telephone Encounter (Signed)
Lmom to r/s appt  ?

## 2021-06-15 ENCOUNTER — Ambulatory Visit (INDEPENDENT_AMBULATORY_CARE_PROVIDER_SITE_OTHER): Payer: Medicaid Other

## 2021-06-15 DIAGNOSIS — Z7901 Long term (current) use of anticoagulants: Secondary | ICD-10-CM

## 2021-06-15 DIAGNOSIS — Z5181 Encounter for therapeutic drug level monitoring: Secondary | ICD-10-CM | POA: Diagnosis not present

## 2021-06-15 DIAGNOSIS — Z952 Presence of prosthetic heart valve: Secondary | ICD-10-CM

## 2021-06-15 LAB — POCT INR: INR: 2.8 (ref 2.0–3.0)

## 2021-06-15 NOTE — Patient Instructions (Signed)
continue taking 1 tablet daily except 0.5 tablet on Monday, Wednesday and Friday.  INR in 6 weeks.  [phone] ?

## 2021-06-23 ENCOUNTER — Encounter: Payer: Self-pay | Admitting: Cardiology

## 2021-06-23 ENCOUNTER — Ambulatory Visit (INDEPENDENT_AMBULATORY_CARE_PROVIDER_SITE_OTHER): Payer: Medicaid Other | Admitting: Cardiology

## 2021-06-23 VITALS — BP 128/90 | HR 69 | Ht 62.0 in | Wt 149.0 lb

## 2021-06-23 DIAGNOSIS — E785 Hyperlipidemia, unspecified: Secondary | ICD-10-CM | POA: Diagnosis not present

## 2021-06-23 DIAGNOSIS — I351 Nonrheumatic aortic (valve) insufficiency: Secondary | ICD-10-CM

## 2021-06-23 DIAGNOSIS — Z8774 Personal history of (corrected) congenital malformations of heart and circulatory system: Secondary | ICD-10-CM | POA: Diagnosis not present

## 2021-06-23 DIAGNOSIS — Z952 Presence of prosthetic heart valve: Secondary | ICD-10-CM | POA: Diagnosis not present

## 2021-06-23 NOTE — Patient Instructions (Signed)
Medication Instructions:  ?Your physician recommends that you continue on your current medications as directed. Please refer to the Current Medication list given to you today.  ?*If you need a refill on your cardiac medications before your next appointment, please call your pharmacy* ? ? ?Lab Work: ?None ?If you have labs (blood work) drawn today and your tests are completely normal, you will receive your results only by: ?MyChart Message (if you have MyChart) OR ?A paper copy in the mail ?If you have any lab test that is abnormal or we need to change your treatment, we will call you to review the results. ? ? ?Testing/Procedures: ?Your physician has requested that you have an echocardiogram. Echocardiography is a painless test that uses sound waves to create images of your heart. It provides your doctor with information about the size and shape of your heart and how well your heart?s chambers and valves are working. This procedure takes approximately one hour. There are no restrictions for this procedure. ? ? ?Follow-Up: ?At [facility], you and your health needs are our priority.  As part of our continuing mission to provide you with exceptional heart care, we have created designated Provider Care Teams.  These Care Teams include your primary Cardiologist (physician) and Advanced Practice Providers (APPs -  Physician Assistants and Nurse Practitioners) who all work together to provide you with the care you need, when you need it. ? ?We recommend signing up for the patient portal called "MyChart".  Sign up information is provided on this After Visit Summary.  MyChart is used to connect with patients for Virtual Visits (Telemedicine).  Patients are able to view lab/test results, encounter notes, upcoming appointments, etc.  Non-urgent messages can be sent to your provider as well.   ?To learn more about what you can do with MyChart, go to [email or web address].   ? ?Your next appointment:   ?1 year(s) ? ?The  format for your next appointment:   ?In Person ? ?Provider:   ?None   ? ? ?Other Instructions ? ? ?Important Information About [place] ? ? ? ? ?  ?

## 2021-06-23 NOTE — Progress Notes (Signed)
?Cardiology Office Note:   ? ?Date:  06/24/2021  ? ?ID:  Tonya Mcintosh, DOB 05/01/1965, MRN [id number] ? ?PCP:  Pcp, No  ?Cardiologist:  Berniece Salines, DO  ?Electrophysiologist:  None  ? ?Referring MD: No ref. provider found  ? ?Chief Complaint  ?Patient presents with  ? Follow-up  ? ?History of Present Illness:   ? ?Tonya Mcintosh is a 56 y.o. 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 Jude's bileaflet mechanical valve, hyperlipidemia. ? ?She previously followed Dr. Glenetta Hew and has been lost to follow-up it appears that her last visit was in 2016.  Her last echo was also done in 2016 at which time EF was normal with her mechanical aortic valve seated with reports of mild to moderate aortic regurgitation. ? ?Today she presents with her daughter to reestablish cardiac care.  She offers no complaints.  She denies any chest pain, any shortness of breath lightheadedness or dizziness.  Her daughter and she is concerned that the patient had not been seen by any cardiologist since 2016 so she encouraged her mom to follow-up. ? ? ? ?Past Medical History:  ?Diagnosis Date  ? Abnormal vaginal Pap smear   ? CIN I  ? Aortic valve replaced 1987; September 2010  ? Endocarditis 2006  ? Treated w/ antibiotics long term, doing well since.  ? H/O insomnia   ? Hypercholesterolemia   ? Hyperlipidemia   ? Kidney calculi   ? Kidney infection   ? VSD (ventricular septal defect) 1979  ? repair  ? ? ?Past Surgical History:  ?Procedure Laterality Date  ? AORTIC VALVE REPLACEMENT  07/17/1985  ? 23-mm Medtronic Hall prosthesis  ? CARDIAC CATHETERIZATION  01/25/1985  ? Preop 10 2010, no significant CAD.  ? CARDIOVASCULAR STRESS TEST  09/22/2008  ? Fixed defect  at the apex. No stress induced ischemia.  ? MECHANICAL AORTIC VALVE REPLACEMENT  October 2010  ? Medtronic prosthetic valve exchanged for a St. Jude tactile prosthesis (bileaflet)  ? TRANSTHORACIC ECHOCARDIOGRAM  12/25/2008  ? EF >55%, doppler evidence of regurg is probably normal of St Jude prosthetic aortic valve.  ? TRANSTHORACIC ECHOCARDIOGRAM  03/19/2013  ? EF 60-65%; ? Grade 3 diastolic dysfunction; Mech Prosthesis of AoV -mild-mod AI. Mildly increased gradient.  ? TUBAL LIGATION  1986  ? VSD REPAIR  02-22-77  ? ? ?Current Medications: ?Current Meds  ?Medication Sig  ? acetaminophen (TYLENOL) 500 MG tablet Take 1 tablet (500 mg total) by mouth every 6 (six) hours as needed for pain. (Patient taking differently: Take 1,000 mg by mouth every 6 (six) hours as needed for pain.)  ? traMADol-acetaminophen (ULTRACET) 37.5-325 MG tablet Take 1 tablet by mouth every 6 (six) hours as needed.  ? warfarin (COUMADIN) 3 MG tablet Take 1/2 a tablet to 1 tablet by mouth daily as directed by the coumadin clinic  ?  ? ?Allergies:   Codeine  ? ?Social History  ? ?Socioeconomic History  ? Marital status: Married  ?  Spouse name: Not on file  ? Number of children: Not on file  ? Years of education: Not on file  ? Highest education level: Not on file  ?Occupational History  ? Not on file  ?  Tobacco Use  ? Smoking status: Never  ? Smokeless tobacco: Never  ?Substance and Sexual Activity  ? Alcohol use: No  ? Drug use: No  ? Sexual activity: Yes  ?  Partners: Male  ?Other Topics Concern  ? Not on file  ?Social History Narrative  ? Lives with ex-husband and children in [place]. No smoking, no etoh, disability  ? ?Social Determinants of Health  ? ?Financial Resource Strain: Not on file  ?Food Insecurity: Not on file  ?Transportation Needs: Not on file  ?Physical Activity: Not on file  ?Stress: Not on file  ?Social Connections: Not on file  ?  ? ?Family History: ?The patient's family history includes Asthma in her sister. ? ?ROS:   ?Review of  Systems  ?Constitution: Negative for decreased appetite, fever and weight gain.  ?HENT: Negative for congestion, ear discharge, hoarse voice and sore throat.   ?Eyes: Negative for discharge, redness, vision loss in right eye and visual halos.  ?Cardiovascular: Negative for chest pain, dyspnea on exertion, leg swelling, orthopnea and palpitations.  ?Respiratory: Negative for cough, hemoptysis, shortness of breath and snoring.   ?Endocrine: Negative for heat intolerance and polyphagia.  ?Hematologic/Lymphatic: Negative for bleeding problem. Does not bruise/bleed easily.  ?Skin: Negative for flushing, nail changes, rash and suspicious lesions.  ?Musculoskeletal: Negative for arthritis, joint pain, muscle cramps, myalgias, neck pain and stiffness.  ?Gastrointestinal: Negative for abdominal pain, bowel incontinence, diarrhea and excessive appetite.  ?Genitourinary: Negative for decreased libido, genital sores and incomplete emptying.  ?Neurological: Negative for brief paralysis, focal weakness, headaches and loss of balance.  ?Psychiatric/Behavioral: Negative for altered mental status, depression and suicidal ideas.  ?Allergic/Immunologic: Negative for HIV exposure and persistent infections.  ? ? ?EKGs/Labs/Other Studies Reviewed:   ? ?The following studies were reviewed today: ? ? ?EKG:  The ekg ordered today demonstrates sinus rhythm ? ?TTE 01/2015 ?History:   PMH:   Congestive heart failure.  Risk factors:  ?Dyslipidemia.  ? ?-------------------------------------------------------------------  ?Study Conclusions  ? ?- Left ventricle: Systolic function was normal. The estimated  ?  ejection fraction was in the range of 60% to 65%. Wall motion was  ?  normal; there were no regional wall motion abnormalities.  ?- Aortic valve: A mechanical prosthesis was present. There was mild  ?  to moderate regurgitation.  ?- Right atrium: The atrium was mildly dilated.  ?- Pulmonic valve: Peak gradient (S): 13 mm Hg.   ? ?-------------------------------------------------------------------  ?Labs, prior tests, procedures, and surgery:  ?Transthoracic echocardiography (03/19/2013).     EF was 65%. Aortic  ?valve: peak gradient of 52 mm Hg and mean gradient of 32 mm Hg.  ? ?Valve surgery.     Aortic valve replacement with a mechanical  ?valve.  ?Transthoracic echocardiography.  M-mode, complete 2D, spectral  ?Doppler, and color Doppler.  Birthdate:  Patient birthdate:  ?05/04/1965.  Age:  Patient is 56 yr old.  Sex:  Gender: female.  ?BMI: 24.2 kg/m^2.  Blood pressure:     128/88  Patient status:  ?Outpatient.  Study date:  Study date: 01/29/2015. Study time: 01:11  ?PM.  Location:  [facility]  ? ?-------------------------------------------------------------------  ? ?-------------------------------------------------------------------  ?Left ventricle:   Wall thickness was normal.   Systolic function  ?was normal. The estimated ejection fraction was in the range of 60%  ?to 65%. Wall motion was normal; there were no regional wall motion  ?abnormalities.  ? ?-------------------------------------------------------------------  ?Aortic valve:  A mechanical prosthesis was present.  Doppler:  ?  There was mild to moderate regurgitation.    VTI ratio of LVOT to  ?aortic valve: 0.29. Peak velocity ratio of LVOT to aortic valve:  ?0.28. Mean velocity ratio of LVOT to aortic valve: 0.3.    Mean  ?gradient (S): 27 mm Hg. Peak gradient (S): 48 mm Hg.  ? ?-------------------------------------------------------------------  ?Aorta:  Aortic root: The aortic root was normal in size.  ?Ascending aorta: The ascending aorta was normal in size.  ? ?-------------------------------------------------------------------  ?Mitral valve:   Structurally normal valve.   Leaflet separation was  ?normal.  Doppler:  Transvalvular velocity was within the normal  ?range. There was no evidence for stenosis. There was no  ?regurgitation.    Peak gradient (D): 3  mm Hg.  ? ?-------------------------------------------------------------------  ?Left atrium:  The atrium was normal in size.  ? ?-------------------------------------------------------------------  ?Right ventricle:

## 2021-06-24 ENCOUNTER — Encounter: Payer: Self-pay | Admitting: Cardiology

## 2021-07-06 ENCOUNTER — Telehealth: Payer: Self-pay | Admitting: Cardiology

## 2021-07-06 MED ORDER — WARFARIN SODIUM 3 MG PO TABS: ORAL_TABLET | ORAL | 0 refills | Status: DC

## 2021-07-06 NOTE — Telephone Encounter (Signed)
?*  STAT* If patient is at the pharmacy, call can be transferred to refill team. ? ? ?1. Which medications need to be refilled? (please list name of each medication and dose if known)  ?warfarin (COUMADIN) 3 MG tablet ? ?2. Which pharmacy/location (including street and city if local pharmacy) is medication to be sent to? ?[address], [place], [place] [address] ? ?3. Do they need a 30 day or 90 day supply?  ?90 day supply ? ?Patient's daughter states the patient took her last tablet yesterday. ? ?

## 2021-07-08 ENCOUNTER — Telehealth: Payer: Self-pay | Admitting: Cardiology

## 2021-07-08 MED ORDER — WARFARIN SODIUM 3 MG PO TABS: ORAL_TABLET | ORAL | 0 refills | Status: DC

## 2021-07-08 NOTE — Telephone Encounter (Signed)
I spoke to patient's daughter and let her know that I refilled Warfarin at the pharmacy and should be ready for pick up soon.  She verbalized understanding. ?

## 2021-07-08 NOTE — Telephone Encounter (Signed)
Patient calling the office for samples of medication: ?  ?  ?1.  What medication and dosage are you requesting samples for? warfarin (COUMADIN) 3 MG tablet ?  ?2.  Are you currently out of this medication? Yes; been out for 2 days ?

## 2021-07-08 NOTE — Telephone Encounter (Signed)
Patient calling the office for samples of medication: ? ? ?1.  What medication and dosage are you requesting samples for? warfarin (COUMADIN) 3 MG tablet ? ?2.  Are you currently out of this medication? Yes; been out for 2 days ?  ?

## 2021-07-13 ENCOUNTER — Other Ambulatory Visit (HOSPITAL_COMMUNITY): Payer: Medicaid Other

## 2021-07-13 ENCOUNTER — Ambulatory Visit (HOSPITAL_COMMUNITY): Payer: Medicaid Other | Attending: Cardiovascular Disease

## 2021-07-14 ENCOUNTER — Encounter (HOSPITAL_COMMUNITY): Payer: Self-pay | Admitting: Cardiology

## 2021-07-27 ENCOUNTER — Ambulatory Visit (INDEPENDENT_AMBULATORY_CARE_PROVIDER_SITE_OTHER): Payer: Medicaid Other

## 2021-07-27 DIAGNOSIS — Z7901 Long term (current) use of anticoagulants: Secondary | ICD-10-CM | POA: Diagnosis not present

## 2021-07-27 DIAGNOSIS — Z952 Presence of prosthetic heart valve: Secondary | ICD-10-CM | POA: Diagnosis not present

## 2021-07-27 DIAGNOSIS — Z5181 Encounter for therapeutic drug level monitoring: Secondary | ICD-10-CM | POA: Diagnosis not present

## 2021-07-27 LAB — POCT INR: INR: 1.6 — AB (ref 2.0–3.0)

## 2021-07-27 NOTE — Patient Instructions (Signed)
TAKE 1 TABLET TODAY AND 2 TABLETS Tuesday and then continue taking 1 tablet daily except 0.5 tablet on Monday, Wednesday and Friday.  INR in 3 weeks.  [phone]

## 2021-07-30 ENCOUNTER — Other Ambulatory Visit: Payer: Self-pay | Admitting: Cardiology

## 2021-07-30 DIAGNOSIS — I4891 Unspecified atrial fibrillation: Secondary | ICD-10-CM

## 2021-07-31 ENCOUNTER — Ambulatory Visit (HOSPITAL_COMMUNITY): Payer: Medicaid Other | Attending: Internal Medicine

## 2021-07-31 DIAGNOSIS — Z952 Presence of prosthetic heart valve: Secondary | ICD-10-CM | POA: Diagnosis present

## 2021-08-02 LAB — ECHOCARDIOGRAM COMPLETE
AR max vel: 1.59 cm2
AV Area VTI: 1.59 cm2
AV Area mean vel: 1.58 cm2
AV Mean grad: 19 mmHg
AV Peak grad: 35.7 mmHg
Ao pk vel: 2.99 m/s
Area-P 1/2: 3.53 cm2
P 1/2 time: 629 msec
S' Lateral: 2.7 cm

## 2021-08-17 ENCOUNTER — Ambulatory Visit (INDEPENDENT_AMBULATORY_CARE_PROVIDER_SITE_OTHER): Payer: Medicaid Other

## 2021-08-17 DIAGNOSIS — Z952 Presence of prosthetic heart valve: Secondary | ICD-10-CM

## 2021-08-17 DIAGNOSIS — Z7901 Long term (current) use of anticoagulants: Secondary | ICD-10-CM

## 2021-08-17 LAB — POCT INR: INR: 2.4 (ref 2.0–3.0)

## 2021-09-25 ENCOUNTER — Ambulatory Visit (INDEPENDENT_AMBULATORY_CARE_PROVIDER_SITE_OTHER): Payer: Medicaid Other | Admitting: *Deleted

## 2021-09-25 DIAGNOSIS — I4891 Unspecified atrial fibrillation: Secondary | ICD-10-CM

## 2021-09-25 DIAGNOSIS — Z952 Presence of prosthetic heart valve: Secondary | ICD-10-CM | POA: Diagnosis not present

## 2021-09-25 DIAGNOSIS — Z7901 Long term (current) use of anticoagulants: Secondary | ICD-10-CM | POA: Diagnosis not present

## 2021-09-25 LAB — POCT INR: INR: 1.5 — AB (ref 2.0–3.0)

## 2021-09-25 NOTE — Patient Instructions (Signed)
Description   Today take 1 tablet and tomorrow take 1.5 tablets then continue taking 1 tablet daily except 1/2 tablet on Monday, Wednesday and Friday. Recheck INR in 1 week.  [phone]

## 2021-10-01 ENCOUNTER — Ambulatory Visit (INDEPENDENT_AMBULATORY_CARE_PROVIDER_SITE_OTHER): Payer: Medicaid Other | Admitting: *Deleted

## 2021-10-01 DIAGNOSIS — Z952 Presence of prosthetic heart valve: Secondary | ICD-10-CM

## 2021-10-01 DIAGNOSIS — I4891 Unspecified atrial fibrillation: Secondary | ICD-10-CM

## 2021-10-01 DIAGNOSIS — Z7901 Long term (current) use of anticoagulants: Secondary | ICD-10-CM

## 2021-10-01 LAB — POCT INR: INR: 2.3 (ref 2.0–3.0)

## 2021-10-01 NOTE — Patient Instructions (Addendum)
Description   Today take 1.5 tablets then start taking 1 tablet daily except 1/2 tablet on Monday and Friday. Recheck INR in 2 weeks. [phone]

## 2021-10-17 ENCOUNTER — Other Ambulatory Visit: Payer: Self-pay | Admitting: Cardiology

## 2021-10-17 DIAGNOSIS — I4891 Unspecified atrial fibrillation: Secondary | ICD-10-CM

## 2021-11-02 ENCOUNTER — Telehealth: Payer: Self-pay | Admitting: Cardiology

## 2021-11-02 DIAGNOSIS — I4891 Unspecified atrial fibrillation: Secondary | ICD-10-CM

## 2021-11-02 DIAGNOSIS — Z7901 Long term (current) use of anticoagulants: Secondary | ICD-10-CM

## 2021-11-02 NOTE — Telephone Encounter (Signed)
   Pre-operative Risk Assessment    Patient Name: Tonya Mcintosh  DOB: [birth year] MRN: [id number]     Request for Surgical Clearance    Procedure:  Dental Extraction - Amount of Teeth to be Pulled:  3 Extractions  Date of Surgery:  TBD                                  Surgeon:  Dr. Bonnee Quin Surgeon's Group or Practice Name:  Dr. Bonnee Quin, DDS Phone number:  [phone] Fax number:  [phone]   Type of Clearance Requested:   - Pharmacy:  Hold Warfarin (Coumadin) 3 Days Prior   Type of Anesthesia:   Septocaine and Lidocaine   Additional requests/questions:  Please fax a copy of medical clearance to the surgeon's office.  [name]   11/02/2021, 2:46 PM

## 2021-11-03 ENCOUNTER — Other Ambulatory Visit: Payer: Self-pay | Admitting: Cardiology

## 2021-11-03 DIAGNOSIS — I4891 Unspecified atrial fibrillation: Secondary | ICD-10-CM

## 2021-11-03 NOTE — Telephone Encounter (Signed)
Patient with diagnosis of A Fib, AVR, and hx of DVT on warfarin for anticoagulation.    Patient is overdue for INR check  Procedure: Dental 3 tooth extractions Date of procedure: TBD   CHA2DS2-VASc Score = 2  This indicates a 2.2% annual risk of stroke. The patient's score is based upon: CHF History: 1 HTN History: 0 Diabetes History: 0 Stroke History: 0 Vascular Disease History: 0 Age Score: 0 Gender Score: 1   Patient does require pre-op antibiotics for dental procedure.  Patient needs updated BMP and CBC before final clearance can be given. Also overdue for INR check.  Will need SBE prophylaxis and likely Lovenox bridge  **This guidance is not considered finalized until pre-operative APP has relayed final recommendations.**

## 2021-11-05 NOTE — Telephone Encounter (Signed)
Pharm team recommending patient have BMET/CBC before clearance can be provided, please arrange and route FYI to [facility] when these are completed.

## 2021-11-05 NOTE — Telephone Encounter (Signed)
Attempted to reach the patient but her mobile number was busy. (I tried to call three times)  Spoke with the patients daughter and she states she will bring her mom in tomorrow at 19 for lab work.   Labs ordered.

## 2021-11-06 ENCOUNTER — Other Ambulatory Visit: Payer: Self-pay

## 2021-11-06 ENCOUNTER — Other Ambulatory Visit: Payer: Medicaid Other

## 2021-11-06 DIAGNOSIS — I4891 Unspecified atrial fibrillation: Secondary | ICD-10-CM

## 2021-11-06 DIAGNOSIS — Z7901 Long term (current) use of anticoagulants: Secondary | ICD-10-CM

## 2021-11-07 LAB — BASIC METABOLIC PANEL
BUN/Creatinine Ratio: 12 (ref 9–23)
BUN: 11 mg/dL (ref 6–24)
CO2: 23 mmol/L (ref 20–29)
Calcium: 9.2 mg/dL (ref 8.7–10.2)
Chloride: 104 mmol/L (ref 96–106)
Creatinine, Ser: 0.95 mg/dL (ref 0.57–1.00)
Glucose: 87 mg/dL (ref 70–99)
Potassium: 4.3 mmol/L (ref 3.5–5.2)
Sodium: 140 mmol/L (ref 134–144)
eGFR: 71 mL/min/{1.73_m2} (ref >59)

## 2021-11-07 LAB — CBC
Hematocrit: 39.9 % (ref 34.0–46.6)
Hemoglobin: 13.1 g/dL (ref 11.1–15.9)
MCH: 29.2 pg (ref 26.6–33.0)
MCHC: 32.8 g/dL (ref 31.5–35.7)
MCV: 89 fL (ref 79–97)
Platelets: 274 10*3/uL (ref 150–450)
RBC: 4.49 x10E6/uL (ref 3.77–5.28)
RDW: 13.5 % (ref 11.7–15.4)
WBC: 3.4 10*3/uL (ref 3.4–10.8)

## 2021-11-09 NOTE — Telephone Encounter (Signed)
I will forward to pre op to review lab results that were needed for pre op clearance.

## 2021-11-09 NOTE — Telephone Encounter (Signed)
Patient has a mechanical aortic valve. Her problem list has afib and VTE on it. I cannot find any other mention or documentation of afib. I only find 1 note from 2003 that says patient had a clot in her spine while she was pregnant. MD requesting a 3 day hold of warfarin. I will ask Dr. Harriet Masson for her input on if a lovenox bridge is needed.

## 2021-11-09 NOTE — Telephone Encounter (Signed)
Pt daughter calling for an update on labs. Informed her that APP [place] said labs were normal and we are awaiting final review for clearance.

## 2021-11-18 ENCOUNTER — Other Ambulatory Visit: Payer: Self-pay | Admitting: Cardiology

## 2021-11-18 DIAGNOSIS — I4891 Unspecified atrial fibrillation: Secondary | ICD-10-CM

## 2021-11-18 NOTE — Telephone Encounter (Signed)
Called pt since she is overdue for an appt and requesting a warfarin refill. There was no answer and no voicemail.

## 2021-11-23 ENCOUNTER — Ambulatory Visit: Payer: Medicaid Other | Attending: Cardiology

## 2021-11-27 ENCOUNTER — Ambulatory Visit: Payer: Medicaid Other | Attending: Cardiology

## 2021-11-27 DIAGNOSIS — Z952 Presence of prosthetic heart valve: Secondary | ICD-10-CM | POA: Diagnosis not present

## 2021-11-27 DIAGNOSIS — Z5181 Encounter for therapeutic drug level monitoring: Secondary | ICD-10-CM

## 2021-11-27 DIAGNOSIS — I4891 Unspecified atrial fibrillation: Secondary | ICD-10-CM

## 2021-11-27 DIAGNOSIS — Z7901 Long term (current) use of anticoagulants: Secondary | ICD-10-CM | POA: Diagnosis not present

## 2021-11-27 LAB — POCT INR: INR: 5.4 — AB (ref 2.0–3.0)

## 2021-11-27 MED ORDER — WARFARIN SODIUM 3 MG PO TABS: ORAL_TABLET | ORAL | 0 refills | Status: DC

## 2021-11-27 NOTE — Patient Instructions (Signed)
HOLD TODAY AND SATURDAY then continue 1 tablet daily except 1/2 tablet on Monday and Friday. Recheck INR in 1 week. [phone];  No Cranberry juice.  Eat greens tonight and Saturday.

## 2021-12-02 NOTE — Telephone Encounter (Signed)
Additionally, Cornell Barman verified that patient is taking amoxicillin prior to dental appointments for SBE prophylaxis.

## 2021-12-02 NOTE — Telephone Encounter (Signed)
Called and spoke with Tory at [facility]. He states patient is not currently scheduled for extractions. Her next appointment is in February 2024. I advised that if extractions are scheduled at that time to please resubmit a request for clearance to [place]. Tory verbalized agreement and thanked me for the call.

## 2021-12-07 ENCOUNTER — Ambulatory Visit: Payer: Medicaid Other

## 2021-12-07 ENCOUNTER — Other Ambulatory Visit: Payer: Self-pay | Admitting: Cardiology

## 2021-12-07 DIAGNOSIS — I4891 Unspecified atrial fibrillation: Secondary | ICD-10-CM

## 2021-12-14 ENCOUNTER — Ambulatory Visit: Payer: Medicaid Other | Attending: Cardiology

## 2021-12-14 DIAGNOSIS — Z952 Presence of prosthetic heart valve: Secondary | ICD-10-CM

## 2021-12-14 DIAGNOSIS — Z7901 Long term (current) use of anticoagulants: Secondary | ICD-10-CM

## 2021-12-14 LAB — POCT INR: INR: 4.5 — AB (ref 2.0–3.0)

## 2021-12-14 NOTE — Patient Instructions (Signed)
Description   HOLD today's dose and then START taking 1 tablet daily except 1/2 tablet on Mondays, Wednesdays and Fridays.  Stay consistent with greens each week (1 per week) Recheck INR in 1 week.  [facility] [phone]

## 2021-12-21 ENCOUNTER — Ambulatory Visit: Payer: Medicaid Other | Attending: Cardiology | Admitting: *Deleted

## 2021-12-21 DIAGNOSIS — I4891 Unspecified atrial fibrillation: Secondary | ICD-10-CM | POA: Diagnosis not present

## 2021-12-21 DIAGNOSIS — Z7901 Long term (current) use of anticoagulants: Secondary | ICD-10-CM

## 2021-12-21 DIAGNOSIS — Z952 Presence of prosthetic heart valve: Secondary | ICD-10-CM

## 2021-12-21 LAB — POCT INR: INR: 3.9 — AB (ref 2.0–3.0)

## 2021-12-21 NOTE — Patient Instructions (Addendum)
Description   HOLD today's dose and then START taking 1/2 tablet daily except 1 tablet on Tuesday, Thursday, and Saturday. Stay consistent with greens each week (1 per week) Recheck INR in 2 weeks. [facility] [phone]

## 2021-12-29 ENCOUNTER — Other Ambulatory Visit (HOSPITAL_COMMUNITY): Payer: Self-pay

## 2021-12-29 MED ORDER — HYDROCODONE-ACETAMINOPHEN 10-325 MG PO TABS
1.0000 | ORAL_TABLET | Freq: Every day | ORAL | 0 refills | Status: DC
  Filled 2021-12-29: qty 150, 30d supply, fill #0

## 2021-12-30 ENCOUNTER — Other Ambulatory Visit (HOSPITAL_COMMUNITY): Payer: Self-pay

## 2022-01-04 ENCOUNTER — Ambulatory Visit: Payer: Medicaid Other

## 2022-01-11 ENCOUNTER — Telehealth: Payer: Self-pay | Admitting: Cardiology

## 2022-01-11 DIAGNOSIS — I4891 Unspecified atrial fibrillation: Secondary | ICD-10-CM

## 2022-01-11 MED ORDER — WARFARIN SODIUM 3 MG PO TABS: ORAL_TABLET | ORAL | 0 refills | Status: DC

## 2022-01-11 NOTE — Telephone Encounter (Signed)
*  STAT* If patient is at the pharmacy, call can be transferred to refill team.   1. Which medications need to be refilled? (please list name of each medication and dose if known) warfarin (COUMADIN) 3 MG tablet  2. Which pharmacy/location (including street and city if local pharmacy) is medication to be sent to? [id number] [place], [place] - [id number]   3. Do they need a 30 day or 90 day supply? 30  Pt only has 3 pills left.

## 2022-01-11 NOTE — Telephone Encounter (Signed)
Prescription refill request received for warfarin Lov: 06/23/21 ([place])  Next INR check: 01/04/22 Warfarin tablet strength: '3mg'$   Pt has scheduled appt on 01/18/22 with [facility]. Appropriate dose and refill sent to requested pharmacy.

## 2022-01-18 ENCOUNTER — Ambulatory Visit: Payer: Medicaid Other | Attending: Cardiology

## 2022-01-18 DIAGNOSIS — Z7901 Long term (current) use of anticoagulants: Secondary | ICD-10-CM

## 2022-01-18 DIAGNOSIS — Z952 Presence of prosthetic heart valve: Secondary | ICD-10-CM | POA: Diagnosis not present

## 2022-01-18 LAB — POCT INR: INR: 3.9 — AB (ref 2.0–3.0)

## 2022-01-18 NOTE — Patient Instructions (Signed)
CONTINUE taking 1/2 tablet daily except 1 tablet on Tuesday, Thursday, and Saturday. Stay consistent with greens each week (1 per week); EAT GREENS TONIGHT; Recheck INR in 3 weeks. [facility] [phone]

## 2022-02-08 ENCOUNTER — Ambulatory Visit: Payer: Medicaid Other | Attending: Cardiology

## 2022-02-26 ENCOUNTER — Other Ambulatory Visit: Payer: Self-pay | Admitting: Cardiology

## 2022-02-26 DIAGNOSIS — I4891 Unspecified atrial fibrillation: Secondary | ICD-10-CM

## 2022-03-04 ENCOUNTER — Ambulatory Visit
Payer: Medicaid Other | Attending: Internal Medicine | Admitting: Pharmacist Clinician (PhC)/ Clinical Pharmacy Specialist

## 2022-03-04 DIAGNOSIS — I4891 Unspecified atrial fibrillation: Secondary | ICD-10-CM

## 2022-03-04 DIAGNOSIS — Z952 Presence of prosthetic heart valve: Secondary | ICD-10-CM | POA: Diagnosis not present

## 2022-03-04 DIAGNOSIS — Z7901 Long term (current) use of anticoagulants: Secondary | ICD-10-CM | POA: Diagnosis not present

## 2022-03-04 LAB — POCT INR: INR: 1.9 — AB (ref 2.0–3.0)

## 2022-03-22 ENCOUNTER — Ambulatory Visit: Payer: Medicaid Other | Attending: Cardiovascular Disease

## 2022-03-23 ENCOUNTER — Other Ambulatory Visit: Payer: Self-pay | Admitting: Cardiology

## 2022-03-23 DIAGNOSIS — I4891 Unspecified atrial fibrillation: Secondary | ICD-10-CM

## 2022-04-01 ENCOUNTER — Ambulatory Visit: Payer: Medicaid Other | Attending: Cardiology

## 2022-04-01 DIAGNOSIS — I4891 Unspecified atrial fibrillation: Secondary | ICD-10-CM | POA: Diagnosis not present

## 2022-04-01 LAB — POCT INR: INR: 1.3 — AB (ref 2.0–3.0)

## 2022-04-01 NOTE — Patient Instructions (Signed)
Description   Take 2 tablets today and 1 tablet tomorrow and then continue taking 1/2 tablet daily except 1 tablet on Tuesday, Thursday, and Saturday.  Stay consistent with greens each week (1 per week);  Recheck INR in 1 week.  [facility] [phone]

## 2022-04-09 ENCOUNTER — Ambulatory Visit: Payer: Medicaid Other | Attending: Internal Medicine

## 2022-04-17 ENCOUNTER — Other Ambulatory Visit: Payer: Self-pay

## 2022-04-17 ENCOUNTER — Emergency Department (HOSPITAL_COMMUNITY)
Admission: EM | Admit: 2022-04-17 | Discharge: 2022-04-17 | Disposition: A | Discharge status: Home / Self Care | Payer: Medicaid Other | Attending: Emergency Medicine | Admitting: Emergency Medicine

## 2022-04-17 ENCOUNTER — Encounter (HOSPITAL_COMMUNITY): Payer: Self-pay

## 2022-04-17 ENCOUNTER — Emergency Department (HOSPITAL_COMMUNITY): Payer: Medicaid Other

## 2022-04-17 DIAGNOSIS — I509 Heart failure, unspecified: Secondary | ICD-10-CM | POA: Insufficient documentation

## 2022-04-17 DIAGNOSIS — R109 Unspecified abdominal pain: Secondary | ICD-10-CM | POA: Diagnosis present

## 2022-04-17 DIAGNOSIS — N12 Tubulo-interstitial nephritis, not specified as acute or chronic: Secondary | ICD-10-CM

## 2022-04-17 DIAGNOSIS — N179 Acute kidney failure, unspecified: Secondary | ICD-10-CM | POA: Diagnosis not present

## 2022-04-17 DIAGNOSIS — R000 Tachycardia, unspecified: Secondary | ICD-10-CM | POA: Diagnosis not present

## 2022-04-17 DIAGNOSIS — R791 Abnormal coagulation profile: Secondary | ICD-10-CM | POA: Diagnosis not present

## 2022-04-17 DIAGNOSIS — E876 Hypokalemia: Secondary | ICD-10-CM

## 2022-04-17 DIAGNOSIS — D72829 Elevated white blood cell count, unspecified: Secondary | ICD-10-CM | POA: Diagnosis not present

## 2022-04-17 DIAGNOSIS — E860 Dehydration: Secondary | ICD-10-CM

## 2022-04-17 LAB — URINALYSIS, ROUTINE W REFLEX MICROSCOPIC
Bilirubin Urine: NEGATIVE
Glucose, UA: NEGATIVE mg/dL
Ketones, ur: NEGATIVE mg/dL
Nitrite: NEGATIVE
Protein, ur: 30 mg/dL — AB
Specific Gravity, Urine: >1.046 — ABNORMAL HIGH (ref 1.005–1.030)
WBC, UA: >50 WBC/hpf (ref 0–5)
pH: 7 (ref 5.0–8.0)

## 2022-04-17 LAB — PROTIME-INR
INR: 1.6 — ABNORMAL HIGH (ref 0.8–1.2)
Prothrombin Time: 19.1 seconds — ABNORMAL HIGH (ref 11.4–15.2)

## 2022-04-17 LAB — LIPASE, BLOOD: Lipase: 27 U/L (ref 11–51)

## 2022-04-17 LAB — CBC WITH DIFFERENTIAL/PLATELET
Abs Immature Granulocytes: 0.02 10*3/uL (ref 0.00–0.07)
Basophils Absolute: 0 10*3/uL (ref 0.0–0.1)
Basophils Relative: 0 %
Eosinophils Absolute: 0 10*3/uL (ref 0.0–0.5)
Eosinophils Relative: 0 %
HCT: 37.8 % (ref 36.0–46.0)
Hemoglobin: 12.5 g/dL (ref 12.0–15.0)
Immature Granulocytes: 0 %
Lymphocytes Relative: 12 %
Lymphs Abs: 1.3 10*3/uL (ref 0.7–4.0)
MCH: 30 pg (ref 26.0–34.0)
MCHC: 33.1 g/dL (ref 30.0–36.0)
MCV: 90.6 fL (ref 80.0–100.0)
Monocytes Absolute: 0.8 10*3/uL (ref 0.1–1.0)
Monocytes Relative: 7 %
Neutro Abs: 8.7 10*3/uL — ABNORMAL HIGH (ref 1.7–7.7)
Neutrophils Relative %: 81 %
Platelets: 235 10*3/uL (ref 150–400)
RBC: 4.17 MIL/uL (ref 3.87–5.11)
RDW: 13.3 % (ref 11.5–15.5)
WBC: 10.8 10*3/uL — ABNORMAL HIGH (ref 4.0–10.5)
nRBC: 0 % (ref 0.0–0.2)

## 2022-04-17 LAB — COMPREHENSIVE METABOLIC PANEL
ALT: 16 U/L (ref 0–44)
AST: 24 U/L (ref 15–41)
Albumin: 3.5 g/dL (ref 3.5–5.0)
Alkaline Phosphatase: 91 U/L (ref 38–126)
Anion gap: 6 (ref 5–15)
BUN: 11 mg/dL (ref 6–20)
CO2: 24 mmol/L (ref 22–32)
Calcium: 8.7 mg/dL — ABNORMAL LOW (ref 8.9–10.3)
Chloride: 103 mmol/L (ref 98–111)
Creatinine, Ser: 1.29 mg/dL — ABNORMAL HIGH (ref 0.44–1.00)
GFR, Estimated: 49 mL/min — ABNORMAL LOW (ref >60)
Glucose, Bld: 124 mg/dL — ABNORMAL HIGH (ref 70–99)
Potassium: 3.2 mmol/L — ABNORMAL LOW (ref 3.5–5.1)
Sodium: 133 mmol/L — ABNORMAL LOW (ref 135–145)
Total Bilirubin: 1.6 mg/dL — ABNORMAL HIGH (ref 0.3–1.2)
Total Protein: 8.6 g/dL — ABNORMAL HIGH (ref 6.5–8.1)

## 2022-04-17 LAB — PREGNANCY, URINE: Preg Test, Ur: NEGATIVE

## 2022-04-17 MED ORDER — MORPHINE SULFATE (PF) 2 MG/ML IV SOLN
2.0000 mg | Freq: Once | INTRAVENOUS | Status: AC
  Administered 2022-04-17: 2 mg via INTRAVENOUS
  Filled 2022-04-17: qty 1

## 2022-04-17 MED ORDER — CEPHALEXIN 500 MG PO CAPS: 1000.0000 mg | ORAL_CAPSULE | Freq: Two times a day (BID) | ORAL | 0 refills | Status: AC

## 2022-04-17 MED ORDER — SODIUM CHLORIDE 0.9 % IV SOLN
1.0000 g | Freq: Once | INTRAVENOUS | Status: AC
  Administered 2022-04-17: 1 g via INTRAVENOUS
  Filled 2022-04-17: qty 10

## 2022-04-17 MED ORDER — ENOXAPARIN SODIUM 40 MG/0.4ML IJ SOSY: 60.0000 mg | PREFILLED_SYRINGE | Freq: Two times a day (BID) | INTRAMUSCULAR | 0 refills | Status: DC

## 2022-04-17 MED ORDER — SULFAMETHOXAZOLE-TRIMETHOPRIM 800-160 MG PO TABS: 1.0000 | ORAL_TABLET | Freq: Two times a day (BID) | ORAL | 0 refills | Status: DC

## 2022-04-17 MED ORDER — IOHEXOL 350 MG/ML SOLN
75.0000 mL | Freq: Once | INTRAVENOUS | Status: AC | PRN
  Administered 2022-04-17: 75 mL via INTRAVENOUS

## 2022-04-17 MED ORDER — IOHEXOL 300 MG/ML  SOLN
75.0000 mL | Freq: Once | INTRAMUSCULAR | Status: AC | PRN
  Administered 2022-04-17: 75 mL via INTRAVENOUS

## 2022-04-17 MED ORDER — POTASSIUM CHLORIDE CRYS ER 20 MEQ PO TBCR
40.0000 meq | EXTENDED_RELEASE_TABLET | Freq: Once | ORAL | Status: AC
  Administered 2022-04-17: 40 meq via ORAL
  Filled 2022-04-17: qty 2

## 2022-04-17 MED ORDER — ONDANSETRON HCL 4 MG/2ML IJ SOLN
4.0000 mg | Freq: Once | INTRAMUSCULAR | Status: AC
  Administered 2022-04-17: 4 mg via INTRAVENOUS
  Filled 2022-04-17: qty 2

## 2022-04-17 MED ORDER — SODIUM CHLORIDE 0.9 % IV BOLUS
500.0000 mL | Freq: Once | INTRAVENOUS | Status: AC
  Administered 2022-04-17: 500 mL via INTRAVENOUS

## 2022-04-17 NOTE — Discharge Instructions (Addendum)
Thank you for letting us take care of you today.  We are prescribing antibiotics (Keflex) to treat your urinary tract infection.  Please take these as prescribed and complete the entire course.  We are also sending you home with Lovenox injections as your INR level is subtherapeutic.  I discussed this with cardiology and they stressed the importance of having you follow-up in the office next week.  Please call them and schedule a follow-up appointment next week.  It is very important that you attend this follow-up to make sure that you are appropriately anticoagulated.  Please continue your Coumadin and other medications as prescribed by your outpatient providers while awaiting this follow-up.  If you develop worsening symptoms such as inability to eat or drink, recurrent vomiting, high fevers, chest pain, shortness of breath, worsening abdominal pain or any other worsening or new concerns, please be reevaluated in the nearest emergency department.

## 2022-04-17 NOTE — ED Triage Notes (Signed)
Patient has had right sided flank pain since Thursday. No burning with urination. Increased generalized weakness. Said she is urinating more. "I can't hold my water." History of heart failure.

## 2022-04-17 NOTE — ED Notes (Signed)
Patient transported to CT 

## 2022-04-17 NOTE — ED Provider Notes (Signed)
[place] [facility] Provider Note   CSN: [id number] Arrival date & time: 04/17/22  1042     History  Chief Complaint  Patient presents with   Flank Pain    Tonya Mcintosh is a 57 y.o. female with past medical history congenital heart disease with VSD and aortic regurgitation with multiple previous repairs with most recent being aortic valve replacement in 2010 with [facility] Jude's bileaflet mechanical valve who presents to the ED complaining of severe right flank pain that started 2 days ago and is associated with nausea.  She has had a couple episodes of emesis but none today.  She reports associated subjective fever.  She denies radiating abdominal pain, diaphoresis, diarrhea, chest pain, shortness of breath, cough, dysuria, hematuria, back pain, dizziness, lightheadedness, syncope.  Reports that she is compliant with her Coumadin.  She does have a history of renal stones but is unsure if this feels similar.  Also reports a history of recurrent UTIs.  Previous abdominal surgeries include tubal ligation.      Home Medications Prior to Admission medications   Medication Sig Start Date End Date Taking? Authorizing Provider  cephALEXin (KEFLEX) 500 MG capsule Take 2 capsules (1,000 mg total) by mouth 2 (two) times daily for 14 days. 04/17/22 05/01/22 Yes Laurelle Skiver L, PA-C  enoxaparin (LOVENOX) 40 MG/0.4ML injection Inject 0.6 mLs (60 mg total) into the skin every 12 (twelve) hours for 7 days. 04/17/22 04/24/22 Yes Thales Knipple L, PA-C  acetaminophen (TYLENOL) 500 MG tablet Take 1 tablet (500 mg total) by mouth every 6 (six) hours as needed for pain. Patient taking differently: Take 1,000 mg by mouth every 6 (six) hours as needed for pain. 10/15/12   Domenic Moras, PA-C  HYDROcodone-acetaminophen [facility]) 10-325 MG tablet Take 1 tablet by mouth 5 (five) times daily as needed for pain 12/29/21     traMADol-acetaminophen (ULTRACET) 37.5-325 MG tablet Take 1  tablet by mouth every 6 (six) hours as needed.    [provider]  warfarin (COUMADIN) 3 MG tablet TAKE 1/2 -1 TABLET BY MOUTH DAILY OR AS DIRECTED BY CLINIC.  NEEDS INR CHECK 03/23/22   Tobb, Kardie, DO      Allergies    Codeine    Review of Systems   Review of Systems  All other systems reviewed and are negative.   Physical Exam Updated Vital Signs BP 105/67   Pulse 67   Temp 98.4 F (36.9 C) (Oral)   Resp 18   Ht '5\' 2"'$  (1.575 m)   Wt 63.5 kg   SpO2 95%   BMI 25.61 kg/m  Physical Exam Vitals and nursing note reviewed.  Constitutional:      General: She is in acute distress (mild secondary to pain).     Appearance: Normal appearance. She is not toxic-appearing or diaphoretic.     Comments: Appears uncomfortable  HENT:     Head: Normocephalic and atraumatic.     Mouth/Throat:     Mouth: Mucous membranes are dry.     Pharynx: Oropharynx is clear. No oropharyngeal exudate or posterior oropharyngeal erythema.  Eyes:     General: No scleral icterus.    Extraocular Movements: Extraocular movements intact.     Conjunctiva/sclera: Conjunctivae normal.  Cardiovascular:     Rate and Rhythm: Regular rhythm. Tachycardia present.  Pulmonary:     Effort: Pulmonary effort is normal. No respiratory distress.     Breath sounds: Normal breath sounds. No stridor. No  wheezing, rhonchi or rales.  Chest:     Chest wall: No tenderness.  Abdominal:     General: Abdomen is flat. There is no distension.     Palpations: Abdomen is soft. There is no shifting dullness, fluid wave or pulsatile mass.     Tenderness: There is abdominal tenderness (moderate RLQ). There is right CVA tenderness (minimal). There is no left CVA tenderness, guarding or rebound. Negative signs include Murphy's sign, Rovsing's sign and McBurney's sign.  Musculoskeletal:        General: Normal range of motion.     Cervical back: Normal range of motion and neck supple. No rigidity.     Right lower leg: No edema.      Left lower leg: No edema.  Skin:    General: Skin is warm and dry.     Capillary Refill: Capillary refill takes less than 2 seconds.     Coloration: Skin is not jaundiced or pale.     Findings: No rash.  Neurological:     General: No focal deficit present.     Mental Status: She is alert and oriented to person, place, and time.  Psychiatric:        Mood and Affect: Mood normal.        Behavior: Behavior normal.     ED Results / Procedures / Treatments   Labs (all labs ordered are listed, but only abnormal results are displayed) Labs Reviewed  URINALYSIS, ROUTINE W REFLEX MICROSCOPIC - Abnormal; Notable for the following components:      Result Value   APPearance HAZY (*)    Specific Gravity, Urine >1.046 (*)    Hgb urine dipstick LARGE (*)    Protein, ur 30 (*)    Leukocytes,Ua LARGE (*)    Bacteria, UA RARE (*)    All other components within normal limits  CBC WITH DIFFERENTIAL/PLATELET - Abnormal; Notable for the following components:   WBC 10.8 (*)    Neutro Abs 8.7 (*)    All other components within normal limits  COMPREHENSIVE METABOLIC PANEL - Abnormal; Notable for the following components:   Sodium 133 (*)    Potassium 3.2 (*)    Glucose, Bld 124 (*)    Creatinine, Ser 1.29 (*)    Calcium 8.7 (*)    Total Protein 8.6 (*)    Total Bilirubin 1.6 (*)    GFR, Estimated 49 (*)    All other components within normal limits  PROTIME-INR - Abnormal; Notable for the following components:   Prothrombin Time 19.1 (*)    INR 1.6 (*)    All other components within normal limits  PREGNANCY, URINE  LIPASE, BLOOD    EKG EKG Interpretation  Date/Time:  Saturday April 17 2022 12:07:50 EST Ventricular Rate:  98 PR Interval:  160 QRS Duration: 84 QT Interval:  333 QTC Calculation: 426 R Axis:   61 Text Interpretation: Sinus rhythm No significant change was found Confirmed by Ezequiel Essex [phone]) on 04/17/2022 12:13:19 PM  Radiology CT ABDOMEN PELVIS W  CONTRAST  Result Date: 04/17/2022 CLINICAL DATA:  Flank pain EXAM: CT ANGIOGRAPHY CHEST CT ABDOMEN AND PELVIS WITH CONTRAST TECHNIQUE: Multidetector CT imaging of the chest was performed using the standard protocol during bolus administration of intravenous contrast. Multiplanar CT image reconstructions and MIPs were obtained to evaluate the vascular anatomy. Multidetector CT imaging of the abdomen and pelvis was performed using the standard protocol during bolus administration of intravenous contrast. RADIATION DOSE REDUCTION: This exam was  performed according to the departmental dose-optimization program which includes automated exposure control, adjustment of the mA and/or kV according to patient size and/or use of iterative reconstruction technique. CONTRAST:  75 mL OMNIPAQUE IOHEXOL 300 MG/ML  SOLN COMPARISON:  CT abdomen and pelvis from 09/21/2012 FINDINGS: CTA CHEST FINDINGS Cardiovascular: Preferential opacification of the thoracic aorta. Prosthetic aortic valve noted. Ascending thoracic aortic aneurysm identified with outer diameter up to 4.2 cm. There is cardiomegaly. No pericardial effusion. Contrast opacification of the pulmonary arteries was adequate to assess for PE, and there was no evidence of filling defects. Mediastinum/Nodes: No enlarged mediastinal, hilar, or axillary lymph nodes. Thyroid gland, trachea, and esophagus demonstrate no significant findings. Lungs/Pleura: Dependent subsegmental atelectasis bilaterally. No alveolar consolidation to suggest pneumonia or pulmonary edema. No pleural effusion or pneumothorax. Musculoskeletal: Median sternotomy wires. No acute osseous abnormalities. Review of the MIP images confirms the above findings. CT ABDOMEN and PELVIS FINDINGS Hepatobiliary: No focal liver abnormality is seen. No gallstones, gallbladder wall thickening, or biliary dilatation. Pancreas: Unremarkable. No pancreatic ductal dilatation or surrounding inflammatory changes. Spleen: Normal  in size without focal abnormality. Adrenals/Urinary Tract: 1 cm cyst right kidney midpole. This does not need to be followed up. Punctate 2 mm right kidney stone. No hydronephrosis. Bilateral perinephric fat stranding which can be seen with in the setting of pyelonephritis or previous obstruction. No adrenal lesions. No stones in the ureters. Urinary bladder demonstrates wall thickening which can be seen with cystitis. Stomach/Bowel: Stomach is within normal limits. Appendix appears normal. No evidence of bowel wall thickening, distention, or inflammatory changes. Vascular/Lymphatic: Aortic atherosclerosis. No enlarged abdominal or pelvic lymph nodes. Reproductive: Uterine masses consistent with fibroids. Left subserosal fibroid measures about 5 cm. Calcified midbody fibroid measures about 5 cm. Posterior subserosal fibroid measures 3 cm. Other: No abdominal wall hernia or abnormality. No abdominopelvic ascites. Musculoskeletal: No acute or significant osseous findings. Review of the MIP images confirms the above findings. IMPRESSION: 1. Cardiomegaly. 2. Ascending thoracic aortic aneurysm, 4.2 cm.  No dissection. 3. Cardiomegaly. 4. Status post aortic valve replacement. 5. Perinephric stranding bilaterally as well as urinary bladder wall thickening suggesting pyelonephritis and/or cystitis. 6. Small right kidney cyst. 7. Punctate nonobstructing right renal stone. 8. Multimyomatous uterus. Electronically Signed   By: Sammie Bench M.D.   On: 04/17/2022 12:22   CT Angio Chest PE W and/or Wo Contrast  Result Date: 04/17/2022 CLINICAL DATA:  Flank pain EXAM: CT ANGIOGRAPHY CHEST CT ABDOMEN AND PELVIS WITH CONTRAST TECHNIQUE: Multidetector CT imaging of the chest was performed using the standard protocol during bolus administration of intravenous contrast. Multiplanar CT image reconstructions and MIPs were obtained to evaluate the vascular anatomy. Multidetector CT imaging of the abdomen and pelvis was performed  using the standard protocol during bolus administration of intravenous contrast. RADIATION DOSE REDUCTION: This exam was performed according to the departmental dose-optimization program which includes automated exposure control, adjustment of the mA and/or kV according to patient size and/or use of iterative reconstruction technique. CONTRAST:  75 mL OMNIPAQUE IOHEXOL 300 MG/ML  SOLN COMPARISON:  CT abdomen and pelvis from 09/21/2012 FINDINGS: CTA CHEST FINDINGS Cardiovascular: Preferential opacification of the thoracic aorta. Prosthetic aortic valve noted. Ascending thoracic aortic aneurysm identified with outer diameter up to 4.2 cm. There is cardiomegaly. No pericardial effusion. Contrast opacification of the pulmonary arteries was adequate to assess for PE, and there was no evidence of filling defects. Mediastinum/Nodes: No enlarged mediastinal, hilar, or axillary lymph nodes. Thyroid gland, trachea, and esophagus demonstrate no significant  findings. Lungs/Pleura: Dependent subsegmental atelectasis bilaterally. No alveolar consolidation to suggest pneumonia or pulmonary edema. No pleural effusion or pneumothorax. Musculoskeletal: Median sternotomy wires. No acute osseous abnormalities. Review of the MIP images confirms the above findings. CT ABDOMEN and PELVIS FINDINGS Hepatobiliary: No focal liver abnormality is seen. No gallstones, gallbladder wall thickening, or biliary dilatation. Pancreas: Unremarkable. No pancreatic ductal dilatation or surrounding inflammatory changes. Spleen: Normal in size without focal abnormality. Adrenals/Urinary Tract: 1 cm cyst right kidney midpole. This does not need to be followed up. Punctate 2 mm right kidney stone. No hydronephrosis. Bilateral perinephric fat stranding which can be seen with in the setting of pyelonephritis or previous obstruction. No adrenal lesions. No stones in the ureters. Urinary bladder demonstrates wall thickening which can be seen with cystitis.  Stomach/Bowel: Stomach is within normal limits. Appendix appears normal. No evidence of bowel wall thickening, distention, or inflammatory changes. Vascular/Lymphatic: Aortic atherosclerosis. No enlarged abdominal or pelvic lymph nodes. Reproductive: Uterine masses consistent with fibroids. Left subserosal fibroid measures about 5 cm. Calcified midbody fibroid measures about 5 cm. Posterior subserosal fibroid measures 3 cm. Other: No abdominal wall hernia or abnormality. No abdominopelvic ascites. Musculoskeletal: No acute or significant osseous findings. Review of the MIP images confirms the above findings. IMPRESSION: 1. Cardiomegaly. 2. Ascending thoracic aortic aneurysm, 4.2 cm.  No dissection. 3. Cardiomegaly. 4. Status post aortic valve replacement. 5. Perinephric stranding bilaterally as well as urinary bladder wall thickening suggesting pyelonephritis and/or cystitis. 6. Small right kidney cyst. 7. Punctate nonobstructing right renal stone. 8. Multimyomatous uterus. Electronically Signed   By: Sammie Bench M.D.   On: 04/17/2022 12:22    Procedures Procedures    Medications Ordered in ED Medications  morphine (PF) 2 MG/ML injection 2 mg (2 mg Intravenous Given 04/17/22 1204)  ondansetron (ZOFRAN) injection 4 mg (4 mg Intravenous Given 04/17/22 1204)  sodium chloride 0.9 % bolus 500 mL (0 mLs Intravenous Stopped 04/17/22 1417)  iohexol (OMNIPAQUE) 300 MG/ML solution 75 mL (75 mLs Intravenous Contrast Given 04/17/22 1137)  iohexol (OMNIPAQUE) 350 MG/ML injection 75 mL (75 mLs Intravenous Contrast Given 04/17/22 1150)  potassium chloride SA (KLOR-CON M) CR tablet 40 mEq (40 mEq Oral Given 04/17/22 1315)  cefTRIAXone (ROCEPHIN) 1 g in sodium chloride 0.9 % 100 mL IVPB (0 g Intravenous Stopped 04/17/22 1344)    ED Course/ Medical Decision Making/ A&P                             Medical Decision Making Amount and/or Complexity of Data Reviewed Labs: ordered. Decision-making details documented in  ED Course. Radiology: ordered. Decision-making details documented in ED Course. ECG/medicine tests: ordered. Decision-making details documented in ED Course.  Risk Prescription drug management. Decision regarding hospitalization.   Medical Decision Making:   Tonya Mcintosh is a 57 y.o. female who presented to the ED today with abdominal pain, detailed above.    Additional history discussed with patient's family/caregivers.  External chart has been reviewed including previous INR and Coumadin management plan. Patient placed on continuous vitals and telemetry monitoring while in ED which was reviewed periodically.  Complete initial physical exam performed, notably the patient was in mild distress secondary to pain with right lower quadrant and right CVA tenderness.  Reviewed and confirmed nursing documentation for past medical history, family history, social history.    Initial Assessment:   With the patient's presentation of abdominal pain, most likely diagnosis is ureteral stone. Other  diagnoses were considered including (but not limited to) gastroenteritis, colitis, small bowel obstruction, appendicitis, cholecystitis, pancreatitis, nephrolithiasis, UTI, pyleonephritis, acute kidney injury, electrolyte disturbance, aortic dissection. These are considered less likely due to history of present illness and physical exam findings.   This is most consistent with an acute complicated illness   Initial Plan:  CBC/CMP to evaluate for underlying infectious/metabolic etiology for patient's abdominal pain  Lipase to evaluate for pancreatitis  EKG to evaluate for cardiac source of pain  CTAB/Pelvis with contrast to evaluate for structural/surgical etiology of patients' severe abdominal pain.  CT chest to evaluate for dissection/aneurysm rupture Urinalysis and repeat physical assessment to evaluate for UTI/Pyelonpehritis  Empiric management of symptoms with escalating pain control and antiemetics  as needed.   Most Recent INR with Medication Adjustment 1.3 Low  (04/01/2022)  Warfarin maintenance plan:  3 mg (3 mg x 1) every Tue, Thu, Sat;  1.5 mg (3 mg x 0.5) all other days Weekly warfarin total: 15 mg  Initial Study Results:   Laboratory  All laboratory results reviewed without evidence of clinically relevant pathology.   Exceptions include: Na 133, K 3.2, Cr 1.29, WBC 10.8, PT 19.1, INR 1.6    EKG EKG was reviewed independently. Rate, rhythm, axis, intervals all examined and without medically relevant abnormality. ST segments without concerns for elevations.    Radiology All images reviewed independently. Agree with radiology report at this time.   CT ABDOMEN PELVIS W CONTRAST  Result Date: 04/17/2022 CLINICAL DATA:  Flank pain EXAM: CT ANGIOGRAPHY CHEST CT ABDOMEN AND PELVIS WITH CONTRAST TECHNIQUE: Multidetector CT imaging of the chest was performed using the standard protocol during bolus administration of intravenous contrast. Multiplanar CT image reconstructions and MIPs were obtained to evaluate the vascular anatomy. Multidetector CT imaging of the abdomen and pelvis was performed using the standard protocol during bolus administration of intravenous contrast. RADIATION DOSE REDUCTION: This exam was performed according to the departmental dose-optimization program which includes automated exposure control, adjustment of the mA and/or kV according to patient size and/or use of iterative reconstruction technique. CONTRAST:  75 mL OMNIPAQUE IOHEXOL 300 MG/ML  SOLN COMPARISON:  CT abdomen and pelvis from 09/21/2012 FINDINGS: CTA CHEST FINDINGS Cardiovascular: Preferential opacification of the thoracic aorta. Prosthetic aortic valve noted. Ascending thoracic aortic aneurysm identified with outer diameter up to 4.2 cm. There is cardiomegaly. No pericardial effusion. Contrast opacification of the pulmonary arteries was adequate to assess for PE, and there was no evidence of filling  defects. Mediastinum/Nodes: No enlarged mediastinal, hilar, or axillary lymph nodes. Thyroid gland, trachea, and esophagus demonstrate no significant findings. Lungs/Pleura: Dependent subsegmental atelectasis bilaterally. No alveolar consolidation to suggest pneumonia or pulmonary edema. No pleural effusion or pneumothorax. Musculoskeletal: Median sternotomy wires. No acute osseous abnormalities. Review of the MIP images confirms the above findings. CT ABDOMEN and PELVIS FINDINGS Hepatobiliary: No focal liver abnormality is seen. No gallstones, gallbladder wall thickening, or biliary dilatation. Pancreas: Unremarkable. No pancreatic ductal dilatation or surrounding inflammatory changes. Spleen: Normal in size without focal abnormality. Adrenals/Urinary Tract: 1 cm cyst right kidney midpole. This does not need to be followed up. Punctate 2 mm right kidney stone. No hydronephrosis. Bilateral perinephric fat stranding which can be seen with in the setting of pyelonephritis or previous obstruction. No adrenal lesions. No stones in the ureters. Urinary bladder demonstrates wall thickening which can be seen with cystitis. Stomach/Bowel: Stomach is within normal limits. Appendix appears normal. No evidence of bowel wall thickening, distention, or inflammatory changes. Vascular/Lymphatic: Aortic  atherosclerosis. No enlarged abdominal or pelvic lymph nodes. Reproductive: Uterine masses consistent with fibroids. Left subserosal fibroid measures about 5 cm. Calcified midbody fibroid measures about 5 cm. Posterior subserosal fibroid measures 3 cm. Other: No abdominal wall hernia or abnormality. No abdominopelvic ascites. Musculoskeletal: No acute or significant osseous findings. Review of the MIP images confirms the above findings. IMPRESSION: 1. Cardiomegaly. 2. Ascending thoracic aortic aneurysm, 4.2 cm.  No dissection. 3. Cardiomegaly. 4. Status post aortic valve replacement. 5. Perinephric stranding bilaterally as well as  urinary bladder wall thickening suggesting pyelonephritis and/or cystitis. 6. Small right kidney cyst. 7. Punctate nonobstructing right renal stone. 8. Multimyomatous uterus. Electronically Signed   By: Sammie Bench M.D.   On: 04/17/2022 12:22   CT Angio Chest PE W and/or Wo Contrast  Result Date: 04/17/2022 CLINICAL DATA:  Flank pain EXAM: CT ANGIOGRAPHY CHEST CT ABDOMEN AND PELVIS WITH CONTRAST TECHNIQUE: Multidetector CT imaging of the chest was performed using the standard protocol during bolus administration of intravenous contrast. Multiplanar CT image reconstructions and MIPs were obtained to evaluate the vascular anatomy. Multidetector CT imaging of the abdomen and pelvis was performed using the standard protocol during bolus administration of intravenous contrast. RADIATION DOSE REDUCTION: This exam was performed according to the departmental dose-optimization program which includes automated exposure control, adjustment of the mA and/or kV according to patient size and/or use of iterative reconstruction technique. CONTRAST:  75 mL OMNIPAQUE IOHEXOL 300 MG/ML  SOLN COMPARISON:  CT abdomen and pelvis from 09/21/2012 FINDINGS: CTA CHEST FINDINGS Cardiovascular: Preferential opacification of the thoracic aorta. Prosthetic aortic valve noted. Ascending thoracic aortic aneurysm identified with outer diameter up to 4.2 cm. There is cardiomegaly. No pericardial effusion. Contrast opacification of the pulmonary arteries was adequate to assess for PE, and there was no evidence of filling defects. Mediastinum/Nodes: No enlarged mediastinal, hilar, or axillary lymph nodes. Thyroid gland, trachea, and esophagus demonstrate no significant findings. Lungs/Pleura: Dependent subsegmental atelectasis bilaterally. No alveolar consolidation to suggest pneumonia or pulmonary edema. No pleural effusion or pneumothorax. Musculoskeletal: Median sternotomy wires. No acute osseous abnormalities. Review of the MIP images  confirms the above findings. CT ABDOMEN and PELVIS FINDINGS Hepatobiliary: No focal liver abnormality is seen. No gallstones, gallbladder wall thickening, or biliary dilatation. Pancreas: Unremarkable. No pancreatic ductal dilatation or surrounding inflammatory changes. Spleen: Normal in size without focal abnormality. Adrenals/Urinary Tract: 1 cm cyst right kidney midpole. This does not need to be followed up. Punctate 2 mm right kidney stone. No hydronephrosis. Bilateral perinephric fat stranding which can be seen with in the setting of pyelonephritis or previous obstruction. No adrenal lesions. No stones in the ureters. Urinary bladder demonstrates wall thickening which can be seen with cystitis. Stomach/Bowel: Stomach is within normal limits. Appendix appears normal. No evidence of bowel wall thickening, distention, or inflammatory changes. Vascular/Lymphatic: Aortic atherosclerosis. No enlarged abdominal or pelvic lymph nodes. Reproductive: Uterine masses consistent with fibroids. Left subserosal fibroid measures about 5 cm. Calcified midbody fibroid measures about 5 cm. Posterior subserosal fibroid measures 3 cm. Other: No abdominal wall hernia or abnormality. No abdominopelvic ascites. Musculoskeletal: No acute or significant osseous findings. Review of the MIP images confirms the above findings. IMPRESSION: 1. Cardiomegaly. 2. Ascending thoracic aortic aneurysm, 4.2 cm.  No dissection. 3. Cardiomegaly. 4. Status post aortic valve replacement. 5. Perinephric stranding bilaterally as well as urinary bladder wall thickening suggesting pyelonephritis and/or cystitis. 6. Small right kidney cyst. 7. Punctate nonobstructing right renal stone. 8. Multimyomatous uterus. Electronically Signed   By: Vonna Kotyk  Pleasure M.D.   On: 04/17/2022 12:22     1300 - On reexamination, patient and daughter updated on findings thus far.  Currently having give a dose of Rocephin to treat potential cystitis/pyelonephritis.  Patient  does have renal stone but none visualized in the ureter on CT scan and no other acute findings on the scan to explain her right flank pain.  Suspect that she may have passed a kidney stone as she is currently much more well-appearing and has had resolution of her pain while awaiting workup in the emergency department.  Pending urinalysis for further evaluation and disposition.  [id number] - Pt/daughter updated on all findings and discharge plan including antibiotics for pyelonephritis and Lovenox injections for subtherapeutic INR.  Extensive counseling regarding the importance of close outpatient cardiology follow-up.  Patient instructed to follow-up in cardiology office next week to discuss anticoagulation plan.  Patient/daughter expressed understanding of the importance of this.  Consults: Cardiology consulted due to subtherapeutic INR.  Okay with 7-day Lovenox bridge until patient can follow-up outpatient in the office.  Discussed importance of reiterating to patient need for close outpatient follow-up.  Pharmacy also consulted to verify Lovenox dosing at 60 mg twice daily.   Final Reassessment and Plan:   This is a 57 year old female presenting to the ED for severe right flank pain for the last 2 days.  Patient does have extensive cardiac history including prior mechanical aortic valve replacement and history of atrial fibrillation anticoagulated on Coumadin.  She reports compliance with anticoagulation regimen.  On exam, patient is in mild distress secondary to pain and does not appear to be able to get comfortable on the stretcher.  Positive right CVA and right lower quadrant tenderness.  Review of chart reveals that recent INRs have been subtherapeutic. Most suspicious for ureteral stone/pyelonephritis but with pt's history will obtain CT chest in addition to abdominal pain workup as above to rule out dissection/aneurysm rupture. Workup revealed slight hyponatremia and hypokalemia and potassium was repleted  in the ED.  Patient has mild AKI with creatinine 1.29.  She is tolerating p.o. intake.  She does seem slightly dehydrated so suspect AKI is related to that.  Mild leukocytosis of 10.8.  INR 1.6 up slightly from previous of 1.3 on 2/8. UA does appear infected with CTAP showing likelihood of cystitis/pyelonephritis. No other significant acute findings on CT scan. Question if pt passed kidney stone given significance of her pain on initial exam which had resolved with singular dose of pain medication on re-examination. Discussed all findings with pt/daughter. One dose of Rocephin given in ED for pyelonephritis treatment. Pt tolerating PO intake in ED and feeling much better. Cardiology consulted due to subtherapeutic INR and will bridge Lovenox for one week and strongly encouraged close outpatient follow-up with cardiology in the office next week.  Patient/daughter expressed understanding of this and aware of importance of antibiotic completion for pyelo as well.  Will discharge patient home with Keflex for pyelonephritis and Lovenox bridging.  Patient/daughter expressed understanding of the importance of close outpatient follow-up and taking medications as prescribed. Discussed pt management with attending who also examined pt, reviewed workup, and agreed with plan. Pt given very strict ED return precautions, all questions answered, and stable for discharge.              Final Clinical Impression(s) / ED Diagnoses Final diagnoses:  Pyelonephritis  Acute kidney injury ([place])  Subtherapeutic international normalized ratio (INR)  Hypokalemia  Dehydration    Rx / DC  Orders ED Discharge Orders          Ordered    enoxaparin (LOVENOX) 40 MG/0.4ML injection  Every 12 hours        04/17/22 1344    sulfamethoxazole-trimethoprim (BACTRIM DS) 800-160 MG tablet  2 times daily,   Status:  Discontinued        04/17/22 1344    cephALEXin (KEFLEX) 500 MG capsule  2 times daily        04/17/22 1402               Turner Daniels 04/17/22 1507    Ezequiel Essex, MD 04/17/22 1711

## 2022-05-05 ENCOUNTER — Ambulatory Visit: Payer: Medicaid Other

## 2022-05-05 ENCOUNTER — Ambulatory Visit: Payer: Medicaid Other | Attending: Internal Medicine

## 2022-05-05 DIAGNOSIS — I4891 Unspecified atrial fibrillation: Secondary | ICD-10-CM

## 2022-05-05 DIAGNOSIS — Z5181 Encounter for therapeutic drug level monitoring: Secondary | ICD-10-CM | POA: Diagnosis not present

## 2022-05-05 LAB — POCT INR: INR: 2.2 (ref 2.0–3.0)

## 2022-05-05 NOTE — Patient Instructions (Signed)
Description   Take an extra 1/2 tablet today START taking 1 tablet daily EXCEPT 0.5 tablet on Mondays, Wednesdays, and Fridays.  Stay consistent with greens each week (1 per week);  Recheck INR in 1 week.  [facility] [phone]

## 2022-05-07 ENCOUNTER — Other Ambulatory Visit: Payer: Self-pay | Admitting: *Deleted

## 2022-05-07 DIAGNOSIS — I4891 Unspecified atrial fibrillation: Secondary | ICD-10-CM

## 2022-05-07 MED ORDER — WARFARIN SODIUM 3 MG PO TABS: ORAL_TABLET | ORAL | 0 refills | Status: DC

## 2022-05-07 NOTE — Telephone Encounter (Signed)
Warfarin 3mg  refill Afib Last INR Visit 05/05/22 Last OV 06/23/21

## 2022-05-13 ENCOUNTER — Ambulatory Visit: Payer: Medicaid Other

## 2022-05-17 ENCOUNTER — Ambulatory Visit: Payer: Medicaid Other | Attending: Cardiovascular Disease

## 2022-05-17 DIAGNOSIS — Z5181 Encounter for therapeutic drug level monitoring: Secondary | ICD-10-CM | POA: Diagnosis not present

## 2022-05-17 DIAGNOSIS — I4891 Unspecified atrial fibrillation: Secondary | ICD-10-CM

## 2022-05-17 LAB — POCT INR: INR: 2.1 (ref 2.0–3.0)

## 2022-05-17 NOTE — Patient Instructions (Signed)
Description   Take 1 tablet today and 1.5 tablets tomorrow and then resume taking 1 tablet daily EXCEPT 0.5 tablet on Mondays, Wednesdays, and Fridays.  Stay consistent with greens each week (1 per week);  Recheck INR in 2 weeks.  [facility] [phone]

## 2022-05-31 ENCOUNTER — Ambulatory Visit: Payer: Medicaid Other

## 2022-06-03 ENCOUNTER — Other Ambulatory Visit: Payer: Self-pay | Admitting: Cardiology

## 2022-06-03 DIAGNOSIS — I4891 Unspecified atrial fibrillation: Secondary | ICD-10-CM

## 2022-06-03 NOTE — Telephone Encounter (Addendum)
Warfarin refill Afib Last INR 05/17/22 & was due 05/31/22; has an appt pending 06/04/22, will address refill on appt tomorrow Last OV 06/23/21

## 2022-06-04 ENCOUNTER — Ambulatory Visit: Payer: Medicaid Other | Attending: Internal Medicine

## 2022-06-04 DIAGNOSIS — I4891 Unspecified atrial fibrillation: Secondary | ICD-10-CM | POA: Diagnosis not present

## 2022-06-04 LAB — POCT INR: INR: 2.5 (ref 2.0–3.0)

## 2022-06-04 NOTE — Patient Instructions (Signed)
Description   Take 1 tablet today and then resume taking 1 tablet daily EXCEPT 0.5 tablet on Mondays, Wednesdays, and Fridays.  Stay consistent with greens each week (1 per week);  Recheck INR in 3 weeks.  [facility] [phone]

## 2022-06-04 NOTE — Telephone Encounter (Signed)
Prescription refill request received for warfarin Lov: 06/23/21 ([place]) Next INR check: 06/25/22 Warfarin tablet strength: 3mg   Appropriate dose. Refill sent.

## 2022-06-25 ENCOUNTER — Ambulatory Visit: Payer: Medicaid Other

## 2022-06-30 ENCOUNTER — Other Ambulatory Visit: Payer: Self-pay | Admitting: Cardiology

## 2022-06-30 DIAGNOSIS — I4891 Unspecified atrial fibrillation: Secondary | ICD-10-CM

## 2022-06-30 NOTE — Telephone Encounter (Signed)
Warfarin 3mg  refill Last sent 06/04/22 for 30 day supply Afib Last INR 06/04/22 & was due 06/25/22 has a pending appt on 07/02/22 Last OV 06/23/21 & pending appt on 07/20/22

## 2022-07-02 ENCOUNTER — Ambulatory Visit: Payer: Medicaid Other

## 2022-07-05 ENCOUNTER — Other Ambulatory Visit: Payer: Self-pay

## 2022-07-05 ENCOUNTER — Emergency Department (HOSPITAL_COMMUNITY)
Admission: EM | Admit: 2022-07-05 | Discharge: 2022-07-05 | Disposition: A | Discharge status: Home / Self Care | Payer: Medicaid Other | Attending: Emergency Medicine | Admitting: Emergency Medicine

## 2022-07-05 ENCOUNTER — Emergency Department (HOSPITAL_COMMUNITY): Payer: Medicaid Other

## 2022-07-05 ENCOUNTER — Encounter (HOSPITAL_COMMUNITY): Payer: Self-pay

## 2022-07-05 DIAGNOSIS — W19XXXA Unspecified fall, initial encounter: Secondary | ICD-10-CM

## 2022-07-05 DIAGNOSIS — Z23 Encounter for immunization: Secondary | ICD-10-CM | POA: Insufficient documentation

## 2022-07-05 DIAGNOSIS — S60312A Abrasion of left thumb, initial encounter: Secondary | ICD-10-CM | POA: Diagnosis not present

## 2022-07-05 DIAGNOSIS — M25562 Pain in left knee: Secondary | ICD-10-CM | POA: Diagnosis present

## 2022-07-05 DIAGNOSIS — Y92030 Kitchen in apartment as the place of occurrence of the external cause: Secondary | ICD-10-CM | POA: Insufficient documentation

## 2022-07-05 DIAGNOSIS — S8392XA Sprain of unspecified site of left knee, initial encounter: Secondary | ICD-10-CM | POA: Insufficient documentation

## 2022-07-05 DIAGNOSIS — W01198A Fall on same level from slipping, tripping and stumbling with subsequent striking against other object, initial encounter: Secondary | ICD-10-CM | POA: Insufficient documentation

## 2022-07-05 LAB — CBC WITH DIFFERENTIAL/PLATELET
Abs Immature Granulocytes: 0.01 10*3/uL (ref 0.00–0.07)
Basophils Absolute: 0 10*3/uL (ref 0.0–0.1)
Basophils Relative: 1 %
Eosinophils Absolute: 0 10*3/uL (ref 0.0–0.5)
Eosinophils Relative: 1 %
HCT: 39.1 % (ref 36.0–46.0)
Hemoglobin: 12.4 g/dL (ref 12.0–15.0)
Immature Granulocytes: 0 %
Lymphocytes Relative: 35 %
Lymphs Abs: 1.4 10*3/uL (ref 0.7–4.0)
MCH: 29.3 pg (ref 26.0–34.0)
MCHC: 31.7 g/dL (ref 30.0–36.0)
MCV: 92.4 fL (ref 80.0–100.0)
Monocytes Absolute: 0.3 10*3/uL (ref 0.1–1.0)
Monocytes Relative: 9 %
Neutro Abs: 2.1 10*3/uL (ref 1.7–7.7)
Neutrophils Relative %: 54 %
Platelets: 336 10*3/uL (ref 150–400)
RBC: 4.23 MIL/uL (ref 3.87–5.11)
RDW: 14.1 % (ref 11.5–15.5)
WBC: 3.9 10*3/uL — ABNORMAL LOW (ref 4.0–10.5)
nRBC: 0 % (ref 0.0–0.2)

## 2022-07-05 LAB — BASIC METABOLIC PANEL
Anion gap: 10 (ref 5–15)
BUN: 9 mg/dL (ref 6–20)
CO2: 24 mmol/L (ref 22–32)
Calcium: 9.1 mg/dL (ref 8.9–10.3)
Chloride: 106 mmol/L (ref 98–111)
Creatinine, Ser: 0.96 mg/dL (ref 0.44–1.00)
GFR, Estimated: >60 mL/min (ref >60)
Glucose, Bld: 98 mg/dL (ref 70–99)
Potassium: 3.6 mmol/L (ref 3.5–5.1)
Sodium: 140 mmol/L (ref 135–145)

## 2022-07-05 LAB — PROTIME-INR
INR: 2.9 — ABNORMAL HIGH (ref 0.8–1.2)
Prothrombin Time: 30.2 seconds — ABNORMAL HIGH (ref 11.4–15.2)

## 2022-07-05 MED ORDER — TETANUS-DIPHTH-ACELL PERTUSSIS 5-2.5-18.5 LF-MCG/0.5 IM SUSY
0.5000 mL | PREFILLED_SYRINGE | Freq: Once | INTRAMUSCULAR | Status: AC
  Administered 2022-07-05: 0.5 mL via INTRAMUSCULAR
  Filled 2022-07-05: qty 0.5

## 2022-07-05 NOTE — ED Provider Notes (Signed)
[place] EMERGENCY DEPARTMENT AT [facility] Provider Note   CSN: [id number] Arrival date & time: 07/05/22  1015     History  Chief Complaint  Patient presents with   Fall    On thinners     Tonya Mcintosh is a 57 y.o. female.  HPI 57 year old female presents after a fall. She was in the kitchen and tripped over the open oven door.  She is not sure how but she cut her left thumb.  She scraped her legs on the ground.  She states she was on the ground for multiple minutes but was never unconscious.  She hit her face/mouth but denies any head injury or headache.  She is on warfarin for a mechanical heart valve.  She denies any chest pain, shortness of breath.  She states her left knee hurt her enough that she could get up.  She is not sure when her last tetanus immunization was.  Home Medications Prior to Admission medications   Medication Sig Start Date End Date Taking? Authorizing Provider  acetaminophen (TYLENOL) 500 MG tablet Take 1 tablet (500 mg total) by mouth every 6 (six) hours as needed for pain. Patient taking differently: Take 1,000 mg by mouth every 6 (six) hours as needed for pain. 10/15/12   Fayrene Helper, PA-C  enoxaparin (LOVENOX) 40 MG/0.4ML injection Inject 0.6 mLs (60 mg total) into the skin every 12 (twelve) hours for 7 days. 04/17/22 04/24/22  Gowens, Lawrence Marseilles, PA-C  HYDROcodone-acetaminophen (NORCO) 10-325 MG tablet Take 1 tablet by mouth 5 (five) times daily as needed for pain 12/29/21     traMADol-acetaminophen (ULTRACET) 37.5-325 MG tablet Take 1 tablet by mouth every 6 (six) hours as needed.    [provider]  warfarin (COUMADIN) 3 MG tablet TAKE 1/2 -1 TABLET BY MOUTH DAILY OR AS DIRECTED BY CLINIC. KEEP UPCOMING APPT. 06/30/22   Thomasene Ripple, DO      Allergies    Codeine    Review of Systems   Review of Systems  Respiratory:  Negative for shortness of breath.   Cardiovascular:  Negative for chest pain.  Gastrointestinal:  Negative for  abdominal pain.  Musculoskeletal:  Positive for arthralgias. Negative for neck pain.  Skin:  Positive for wound.  Neurological:  Negative for headaches.    Physical Exam Updated Vital Signs BP 136/79   Pulse (!) 57   Temp 98.4 F (36.9 C) (Oral)   Resp 16   Ht 5\' 2"  (1.575 m)   Wt 65.3 kg   SpO2 100%   BMI 26.34 kg/m  Physical Exam Vitals and nursing note reviewed.  Constitutional:      Appearance: She is well-developed.  HENT:     Head: Normocephalic and atraumatic.     Comments: No maxillofacial tenderness, obvious head injury, or oral injury Eyes:     Extraocular Movements: Extraocular movements intact.     Pupils: Pupils are equal, round, and reactive to light.  Cardiovascular:     Rate and Rhythm: Normal rate and regular rhythm.     Pulses:          Radial pulses are 2+ on the left side.       Dorsalis pedis pulses are 2+ on the right side and 2+ on the left side.     Heart sounds: Normal heart sounds.     Comments: Click of mechanical heart valve Pulmonary:     Effort: Pulmonary effort is normal.  Breath sounds: Normal breath sounds.  Abdominal:     Palpations: Abdomen is soft.     Tenderness: There is no abdominal tenderness.  Musculoskeletal:     Left hand: Laceration (small abrasion to inner thumb.) present. No swelling or tenderness. Normal range of motion. Normal strength.     Cervical back: No spinous process tenderness or muscular tenderness.     Right hip: Normal range of motion.     Left hip: Normal range of motion.     Right knee: Normal range of motion. No tenderness.     Left knee: Normal range of motion. Tenderness present.     Comments: Small abrasions over bilateral anterior shins, no lower leg tenderness bilaterally.  Patient  Skin:    General: Skin is warm and dry.  Neurological:     Mental Status: She is alert.     ED Results / Procedures / Treatments   Labs (all labs ordered are listed, but only abnormal results are  displayed) Labs Reviewed  PROTIME-INR - Abnormal; Notable for the following components:      Result Value   Prothrombin Time 30.2 (*)    INR 2.9 (*)    All other components within normal limits  CBC WITH DIFFERENTIAL/PLATELET - Abnormal; Notable for the following components:   WBC 3.9 (*)    All other components within normal limits  BASIC METABOLIC PANEL    EKG EKG Interpretation  Date/Time:  Monday Jul 05 2022 10:22:26 EDT Ventricular Rate:  59 PR Interval:  199 QRS Duration: 93 QT Interval:  464 QTC Calculation: 460 R Axis:   11 Text Interpretation: Sinus rhythm Borderline T abnormalities, anterior leads rate is slower compared to Feb 2024. Otherwise similar nonspecific changes Confirmed by Pricilla Loveless [phone]) on 07/05/2022 10:34:37 AM  Radiology CT Head Wo Contrast  Result Date: 07/05/2022 CLINICAL DATA:  Head trauma, coagulopathy (Age 44-64y) EXAM: CT HEAD WITHOUT CONTRAST TECHNIQUE: Contiguous axial images were obtained from the base of the skull through the vertex without intravenous contrast. RADIATION DOSE REDUCTION: This exam was performed according to the departmental dose-optimization program which includes automated exposure control, adjustment of the mA and/or kV according to patient size and/or use of iterative reconstruction technique. COMPARISON:  12/25/2004 FINDINGS: Brain: No evidence of acute infarction, hemorrhage, hydrocephalus, extra-axial collection or mass lesion/mass effect. Vascular: No hyperdense vessel or unexpected calcification. Skull: Normal. Negative for fracture or focal lesion. Sinuses/Orbits: No acute finding. Other: None. IMPRESSION: No acute intracranial abnormality. Electronically Signed   By: Duanne Guess D.O.   On: 07/05/2022 11:32   DG Knee Complete 4 Views Left  Result Date: 07/05/2022 CLINICAL DATA:  Fall with left knee pain for EXAM: LEFT KNEE - COMPLETE 4+ VIEW COMPARISON:  None Available. FINDINGS: Mild narrowing of the medial  compartment compatible with minimal osteoarthritic change. Lateral compartment and patellofemoral joints are normal. No evidence of joint effusion. No acute fracture or dislocation. IMPRESSION: 1. No acute findings. 2. Minimal osteoarthritis. Electronically Signed   By: Elberta Fortis M.D.   On: 07/05/2022 11:23   DG Finger Thumb Left  Result Date: 07/05/2022 CLINICAL DATA:  Pain after fall EXAM: LEFT THUMB 3V COMPARISON:  None Available. FINDINGS: No fracture or dislocation. Preserved bone mineralization. Mild degenerative changes of the first metacarpal phalangeal joint in the first carpometacarpal joint. No radiopaque foreign body identified. Please correlate with the exact location of the laceration IMPRESSION: No acute osseous abnormality. Electronically Signed   By: Piedad Climes.D.  On: 07/05/2022 11:20    Procedures Procedures    Medications Ordered in ED Medications  Tdap (BOOSTRIX) injection 0.5 mL (0.5 mLs Intramuscular Given 07/05/22 1124)    ED Course/ Medical Decision Making/ A&P                             Medical Decision Making Amount and/or Complexity of Data Reviewed Labs: ordered.    Details: INR is therapeutic Radiology: ordered and independent interpretation performed.    Details: CT head without head bleed.  No fractures on the x-rays  Risk Prescription drug management.   After a trip and fall.  She is on warfarin and did INR is therapeutic.  Hit her head/neck area so a head CT was obtained but is unremarkable.  She has a small skin abrasion to her thumb that does not need repair.  Is not near her nail.  Otherwise she has some abrasions.  Tdap was updated.  At this point, she is well-appearing and was able to ambulate with some mild discomfort but I have low concern for an occult fracture.  Stable for discharge home with return precautions.        Final Clinical Impression(s) / ED Diagnoses Final diagnoses:  Fall, initial encounter  Abrasion of left  thumb, initial encounter  Sprain of left knee, unspecified ligament, initial encounter    Rx / DC Orders ED Discharge Orders     None         Pricilla Loveless, MD 07/05/22 1510

## 2022-07-05 NOTE — ED Triage Notes (Signed)
Pt arrives via [facility]. Pt accidentally tripped over the door to her stove. Pt patient c/o pain to left knee and has a laceration to left thumb. Pt states she struck her mouth on the floor, no obvious injury. Pt is on Coumadin. Denies LOC and is AxOx4.

## 2022-07-05 NOTE — ED Notes (Signed)
Pt able to ambulate without assistance, denies dizziness, reports some soreness to left knee.

## 2022-07-05 NOTE — ED Notes (Signed)
PT to imaging

## 2022-07-05 NOTE — Discharge Instructions (Signed)
Keep your abrasions/wounds clean and dry.  You may use soap and water to clean them.  If you develop new or worsening headache, numbness or weakness, vomiting, or any other new/concerning symptoms then return to the ER or call 911.

## 2022-07-08 ENCOUNTER — Ambulatory Visit: Payer: Medicaid Other

## 2022-07-13 ENCOUNTER — Other Ambulatory Visit: Payer: Self-pay | Admitting: Cardiology

## 2022-07-13 DIAGNOSIS — I4891 Unspecified atrial fibrillation: Secondary | ICD-10-CM

## 2022-07-15 ENCOUNTER — Ambulatory Visit: Payer: Medicaid Other | Attending: Internal Medicine | Admitting: *Deleted

## 2022-07-15 DIAGNOSIS — I4891 Unspecified atrial fibrillation: Secondary | ICD-10-CM | POA: Diagnosis not present

## 2022-07-15 DIAGNOSIS — Z5181 Encounter for therapeutic drug level monitoring: Secondary | ICD-10-CM

## 2022-07-15 LAB — POCT INR: POC INR: 2.7

## 2022-07-15 NOTE — Patient Instructions (Addendum)
Description   Continue taking 1 tablet daily EXCEPT 0.5 tablet on Mondays, Wednesdays, and Fridays.  Stay consistent with greens each week (1 per week);  Recheck INR in 4  weeks.  Coumadin Clinic [phone]

## 2022-07-20 ENCOUNTER — Ambulatory Visit: Payer: Medicaid Other | Attending: Cardiology | Admitting: Cardiology

## 2022-08-03 ENCOUNTER — Encounter (HOSPITAL_COMMUNITY): Payer: Self-pay

## 2022-08-03 ENCOUNTER — Emergency Department (HOSPITAL_COMMUNITY)
Admission: EM | Admit: 2022-08-03 | Discharge: 2022-08-03 | Disposition: A | Discharge status: Home / Self Care | Payer: Medicaid Other | Attending: Emergency Medicine | Admitting: Emergency Medicine

## 2022-08-03 DIAGNOSIS — B9689 Other specified bacterial agents as the cause of diseases classified elsewhere: Secondary | ICD-10-CM | POA: Diagnosis not present

## 2022-08-03 DIAGNOSIS — N760 Acute vaginitis: Secondary | ICD-10-CM | POA: Diagnosis not present

## 2022-08-03 DIAGNOSIS — R011 Cardiac murmur, unspecified: Secondary | ICD-10-CM | POA: Diagnosis not present

## 2022-08-03 DIAGNOSIS — N939 Abnormal uterine and vaginal bleeding, unspecified: Secondary | ICD-10-CM | POA: Diagnosis present

## 2022-08-03 DIAGNOSIS — A5901 Trichomonal vulvovaginitis: Secondary | ICD-10-CM | POA: Insufficient documentation

## 2022-08-03 DIAGNOSIS — Z7901 Long term (current) use of anticoagulants: Secondary | ICD-10-CM | POA: Diagnosis not present

## 2022-08-03 LAB — COMPREHENSIVE METABOLIC PANEL
ALT: 14 U/L (ref 0–44)
AST: 21 U/L (ref 15–41)
Albumin: 3.9 g/dL (ref 3.5–5.0)
Alkaline Phosphatase: 71 U/L (ref 38–126)
Anion gap: 6 (ref 5–15)
BUN: 5 mg/dL — ABNORMAL LOW (ref 6–20)
CO2: 26 mmol/L (ref 22–32)
Calcium: 9.2 mg/dL (ref 8.9–10.3)
Chloride: 106 mmol/L (ref 98–111)
Creatinine, Ser: 1.07 mg/dL — ABNORMAL HIGH (ref 0.44–1.00)
GFR, Estimated: >60 mL/min (ref >60)
Glucose, Bld: 94 mg/dL (ref 70–99)
Potassium: 3.4 mmol/L — ABNORMAL LOW (ref 3.5–5.1)
Sodium: 138 mmol/L (ref 135–145)
Total Bilirubin: 0.6 mg/dL (ref 0.3–1.2)
Total Protein: 8.4 g/dL — ABNORMAL HIGH (ref 6.5–8.1)

## 2022-08-03 LAB — CBC
HCT: 40.1 % (ref 36.0–46.0)
Hemoglobin: 13.1 g/dL (ref 12.0–15.0)
MCH: 29.4 pg (ref 26.0–34.0)
MCHC: 32.7 g/dL (ref 30.0–36.0)
MCV: 90.1 fL (ref 80.0–100.0)
Platelets: 329 10*3/uL (ref 150–400)
RBC: 4.45 MIL/uL (ref 3.87–5.11)
RDW: 14.1 % (ref 11.5–15.5)
WBC: 5.1 10*3/uL (ref 4.0–10.5)
nRBC: 0 % (ref 0.0–0.2)

## 2022-08-03 LAB — WET PREP, GENITAL
Sperm: NONE SEEN
WBC, Wet Prep HPF POC: >=10 — AB (ref <10)
Yeast Wet Prep HPF POC: NONE SEEN

## 2022-08-03 LAB — I-STAT BETA HCG BLOOD, ED (MC, WL, AP ONLY): I-stat hCG, quantitative: <5 m[IU]/mL (ref <5)

## 2022-08-03 LAB — PROTIME-INR
INR: 2.8 — ABNORMAL HIGH (ref 0.8–1.2)
Prothrombin Time: 29.6 seconds — ABNORMAL HIGH (ref 11.4–15.2)

## 2022-08-03 LAB — HIV ANTIBODY (ROUTINE TESTING W REFLEX): HIV Screen 4th Generation wRfx: NONREACTIVE

## 2022-08-03 MED ORDER — METRONIDAZOLE 500 MG PO TABS: 500.0000 mg | ORAL_TABLET | Freq: Two times a day (BID) | ORAL | 0 refills | Status: DC

## 2022-08-03 NOTE — ED Provider Notes (Addendum)
[place] EMERGENCY DEPARTMENT AT [facility] Provider Note   CSN: [id number] Arrival date & time: 08/03/22  1432     History  Chief Complaint  Patient presents with   Vaginal Bleeding   Nausea    Tonya Mcintosh is a 57 y.o. female.  Patient is a 57 year old female who presents with vaginal bleeding.  She is postmenopausal and has not had a menstrual cycle in years although she does report she had prior history of uterine fibroids.  She complains of some intermittent vaginal bleeding over the last 5 days.  She does not feel that it is very heavy although it is like a light menstrual cycle at times.  She denies any abdominal pain.  She denies any vaginal discharge.  No urinary symptoms.  She has had a little nausea but no vomiting.  No fevers.  Of note, she is on Coumadin for mechanical valve.  She says she is compliant with Coumadin.  She also appears to be on amlodipine per chart review but she says she is not taking this.       Home Medications Prior to Admission medications   Medication Sig Start Date End Date Taking? Authorizing Provider  metroNIDAZOLE (FLAGYL) 500 MG tablet Take 1 tablet (500 mg total) by mouth 2 (two) times daily. 08/03/22  Yes Rolan Bucco, MD  acetaminophen (TYLENOL) 500 MG tablet Take 1 tablet (500 mg total) by mouth every 6 (six) hours as needed for pain. Patient taking differently: Take 1,000 mg by mouth every 6 (six) hours as needed for pain. 10/15/12   Fayrene Helper, PA-C  enoxaparin (LOVENOX) 40 MG/0.4ML injection Inject 0.6 mLs (60 mg total) into the skin every 12 (twelve) hours for 7 days. 04/17/22 04/24/22  Gowens, Lawrence Marseilles, PA-C  HYDROcodone-acetaminophen (NORCO) 10-325 MG tablet Take 1 tablet by mouth 5 (five) times daily as needed for pain 12/29/21     traMADol-acetaminophen (ULTRACET) 37.5-325 MG tablet Take 1 tablet by mouth every 6 (six) hours as needed.    [provider]  warfarin (COUMADIN) 3 MG tablet TAKE 1/2 -1 TABLET BY  MOUTH DAILY OR AS DIRECTED BY CLINIC. KEEP UPCOMING APPT. 07/13/22   Tobb, Kardie, DO      Allergies    Codeine    Review of Systems   Review of Systems  Constitutional:  Negative for chills, diaphoresis, fatigue and fever.  HENT:  Negative for congestion, rhinorrhea and sneezing.   Eyes: Negative.   Respiratory:  Negative for cough, chest tightness and shortness of breath.   Cardiovascular:  Negative for chest pain and leg swelling.  Gastrointestinal:  Positive for nausea. Negative for abdominal pain, blood in stool, diarrhea and vomiting.  Genitourinary:  Positive for vaginal bleeding. Negative for difficulty urinating, flank pain, frequency, hematuria and vaginal discharge.  Musculoskeletal:  Negative for arthralgias and back pain.  Skin:  Negative for rash.  Neurological:  Negative for dizziness, speech difficulty, weakness, numbness and headaches.    Physical Exam Updated Vital Signs BP 111/89   Pulse 65   Temp 98.4 F (36.9 C) (Oral)   Resp 16   SpO2 100%  Physical Exam Constitutional:      Appearance: She is well-developed.  HENT:     Head: Normocephalic and atraumatic.  Eyes:     Pupils: Pupils are equal, round, and reactive to light.  Cardiovascular:     Rate and Rhythm: Normal rate and regular rhythm.     Heart sounds: Murmur heard.  Pulmonary:  Effort: Pulmonary effort is normal. No respiratory distress.     Breath sounds: Normal breath sounds. No wheezing or rales.  Chest:     Chest wall: No tenderness.  Abdominal:     General: Bowel sounds are normal.     Palpations: Abdomen is soft.     Tenderness: There is no abdominal tenderness. There is no guarding or rebound.  Genitourinary:    Comments: Large amount of white discharge, no bleeding noted, no cervical motion tenderness, no adnexal tenderness Musculoskeletal:        General: Normal range of motion.     Cervical back: Normal range of motion and neck supple.  Lymphadenopathy:     Cervical: No  cervical adenopathy.  Skin:    General: Skin is warm and dry.     Findings: No rash.  Neurological:     Mental Status: She is alert and oriented to person, place, and time.     ED Results / Procedures / Treatments   Labs (all labs ordered are listed, but only abnormal results are displayed) Labs Reviewed  WET PREP, GENITAL - Abnormal; Notable for the following components:      Result Value   Trich, Wet Prep PRESENT (*)    Clue Cells Wet Prep HPF POC PRESENT (*)    WBC, Wet Prep HPF POC >=10 (*)    All other components within normal limits  COMPREHENSIVE METABOLIC PANEL - Abnormal; Notable for the following components:   Potassium 3.4 (*)    BUN 5 (*)    Creatinine, Ser 1.07 (*)    Total Protein 8.4 (*)    All other components within normal limits  PROTIME-INR - Abnormal; Notable for the following components:   Prothrombin Time 29.6 (*)    INR 2.8 (*)    All other components within normal limits  CBC  RPR  HIV ANTIBODY (ROUTINE TESTING W REFLEX)  I-STAT BETA HCG BLOOD, ED ([facility], [facility], [facility] ONLY)  GC/CHLAMYDIA PROBE AMP ([place]) NOT AT [facility]    EKG None  Radiology No results found.  Procedures Procedures    Medications Ordered in ED Medications - No data to display  ED Course/ Medical Decision Making/ A&P                             Medical Decision Making Amount and/or Complexity of Data Reviewed Labs: ordered.  Risk Prescription drug management.   Patient is a 57 year old who presents with vaginal bleeding.  On exam, I do not see any bleeding.  She does have significant mount of discharge.  This was positive for trichomonas and BV.  Other STD testing is pending.  Will start her on Flagyl.  Discussed presumptively treating her with Rocephin and doxycycline but at this point she just wants to stay on the Flagyl until her other results come back.  She was given a referral to follow-up with the women's outpatient clinic as needed.  Return precautions were given.   I did advise the patient to let her Coumadin clinic know that we are starting the antibiotics so they can check her INR more frequently.  Her INR was checked and it was within the rate normal range.  CBC shows a normal hemoglobin.  Blood work is otherwise nonconcerning.  Final Clinical Impression(s) / ED Diagnoses Final diagnoses:  Trichomonal leukorrhea vaginalis  BV (bacterial vaginosis)    Rx / DC Orders ED Discharge Orders  Ordered    metroNIDAZOLE (FLAGYL) 500 MG tablet  2 times daily        08/03/22 1917              Rolan Bucco, MD 08/03/22 1610    Rolan Bucco, MD 08/03/22 Babette Relic    Rolan Bucco, MD 08/03/22 2019

## 2022-08-03 NOTE — ED Triage Notes (Signed)
Pt arrives via [facility]. Pt reports she has been having vaginal bleeding since Sunday and nausea since Wednesday. Pt denies abdominal pain or any other urinary symptoms.

## 2022-08-04 LAB — GC/CHLAMYDIA PROBE AMP (~~LOC~~) NOT AT ARMC
Chlamydia: NEGATIVE
Comment: NEGATIVE
Comment: NORMAL
Neisseria Gonorrhea: NEGATIVE

## 2022-08-04 LAB — RPR: RPR Ser Ql: NONREACTIVE

## 2022-08-12 ENCOUNTER — Ambulatory Visit: Payer: Medicaid Other | Attending: Cardiology

## 2022-08-28 ENCOUNTER — Other Ambulatory Visit: Payer: Self-pay | Admitting: Cardiology

## 2022-08-28 DIAGNOSIS — I4891 Unspecified atrial fibrillation: Secondary | ICD-10-CM

## 2022-08-31 ENCOUNTER — Emergency Department (HOSPITAL_COMMUNITY)
Admission: EM | Admit: 2022-08-31 | Discharge: 2022-08-31 | Disposition: A | Discharge status: Home / Self Care | Payer: Medicaid Other | Attending: Emergency Medicine | Admitting: Emergency Medicine

## 2022-08-31 ENCOUNTER — Encounter (HOSPITAL_COMMUNITY): Payer: Self-pay

## 2022-08-31 ENCOUNTER — Emergency Department (HOSPITAL_COMMUNITY): Payer: Medicaid Other

## 2022-08-31 DIAGNOSIS — Z7901 Long term (current) use of anticoagulants: Secondary | ICD-10-CM | POA: Insufficient documentation

## 2022-08-31 DIAGNOSIS — R109 Unspecified abdominal pain: Secondary | ICD-10-CM

## 2022-08-31 DIAGNOSIS — N200 Calculus of kidney: Secondary | ICD-10-CM | POA: Insufficient documentation

## 2022-08-31 LAB — COMPREHENSIVE METABOLIC PANEL
ALT: 17 U/L (ref 0–44)
AST: 24 U/L (ref 15–41)
Albumin: 4 g/dL (ref 3.5–5.0)
Alkaline Phosphatase: 70 U/L (ref 38–126)
Anion gap: 8 (ref 5–15)
BUN: 8 mg/dL (ref 6–20)
CO2: 25 mmol/L (ref 22–32)
Calcium: 8.7 mg/dL — ABNORMAL LOW (ref 8.9–10.3)
Chloride: 105 mmol/L (ref 98–111)
Creatinine, Ser: 0.86 mg/dL (ref 0.44–1.00)
GFR, Estimated: >60 mL/min (ref >60)
Glucose, Bld: 96 mg/dL (ref 70–99)
Potassium: 2.8 mmol/L — ABNORMAL LOW (ref 3.5–5.1)
Sodium: 138 mmol/L (ref 135–145)
Total Bilirubin: 0.6 mg/dL (ref 0.3–1.2)
Total Protein: 8.6 g/dL — ABNORMAL HIGH (ref 6.5–8.1)

## 2022-08-31 LAB — CBC WITH DIFFERENTIAL/PLATELET
Abs Immature Granulocytes: 0.01 10*3/uL (ref 0.00–0.07)
Basophils Absolute: 0 10*3/uL (ref 0.0–0.1)
Basophils Relative: 1 %
Eosinophils Absolute: 0.1 10*3/uL (ref 0.0–0.5)
Eosinophils Relative: 1 %
HCT: 39.5 % (ref 36.0–46.0)
Hemoglobin: 12.8 g/dL (ref 12.0–15.0)
Immature Granulocytes: 0 %
Lymphocytes Relative: 37 %
Lymphs Abs: 1.8 10*3/uL (ref 0.7–4.0)
MCH: 29.5 pg (ref 26.0–34.0)
MCHC: 32.4 g/dL (ref 30.0–36.0)
MCV: 91 fL (ref 80.0–100.0)
Monocytes Absolute: 0.3 10*3/uL (ref 0.1–1.0)
Monocytes Relative: 7 %
Neutro Abs: 2.6 10*3/uL (ref 1.7–7.7)
Neutrophils Relative %: 54 %
Platelets: 295 10*3/uL (ref 150–400)
RBC: 4.34 MIL/uL (ref 3.87–5.11)
RDW: 14 % (ref 11.5–15.5)
WBC: 4.8 10*3/uL (ref 4.0–10.5)
nRBC: 0 % (ref 0.0–0.2)

## 2022-08-31 LAB — URINALYSIS, W/ REFLEX TO CULTURE (INFECTION SUSPECTED)
Bilirubin Urine: NEGATIVE
Glucose, UA: NEGATIVE mg/dL
Ketones, ur: NEGATIVE mg/dL
Nitrite: NEGATIVE
Protein, ur: NEGATIVE mg/dL
Specific Gravity, Urine: 1.009 (ref 1.005–1.030)
pH: 7 (ref 5.0–8.0)

## 2022-08-31 LAB — LIPASE, BLOOD: Lipase: 28 U/L (ref 11–51)

## 2022-08-31 MED ORDER — POTASSIUM CHLORIDE CRYS ER 20 MEQ PO TBCR
40.0000 meq | EXTENDED_RELEASE_TABLET | Freq: Once | ORAL | Status: AC
  Administered 2022-08-31: 40 meq via ORAL
  Filled 2022-08-31: qty 2

## 2022-08-31 MED ORDER — KETOROLAC TROMETHAMINE 30 MG/ML IJ SOLN: 30.0000 mg | Freq: Once | INTRAMUSCULAR | Status: DC

## 2022-08-31 MED ORDER — ONDANSETRON HCL 4 MG/2ML IJ SOLN
4.0000 mg | Freq: Once | INTRAMUSCULAR | Status: AC
  Filled 2022-08-31: qty 2

## 2022-08-31 MED ORDER — ONDANSETRON HCL 4 MG/2ML IJ SOLN
INTRAMUSCULAR | Status: AC
  Administered 2022-08-31: 4 mg via INTRAVENOUS
  Filled 2022-08-31: qty 2

## 2022-08-31 MED ORDER — CEPHALEXIN 500 MG PO CAPS: 500.0000 mg | ORAL_CAPSULE | Freq: Three times a day (TID) | ORAL | 0 refills | Status: AC

## 2022-08-31 MED ORDER — KETOROLAC TROMETHAMINE 30 MG/ML IJ SOLN
30.0000 mg | Freq: Once | INTRAMUSCULAR | Status: AC
  Administered 2022-08-31: 30 mg via INTRAMUSCULAR
  Filled 2022-08-31: qty 1

## 2022-08-31 MED ORDER — POTASSIUM CHLORIDE CRYS ER 20 MEQ PO TBCR: 20.0000 meq | EXTENDED_RELEASE_TABLET | Freq: Once | ORAL | 0 refills | Status: DC

## 2022-08-31 MED ORDER — TAMSULOSIN HCL 0.4 MG PO CAPS: 0.4000 mg | ORAL_CAPSULE | Freq: Every day | ORAL | 0 refills | Status: AC

## 2022-08-31 MED ORDER — SODIUM CHLORIDE 0.9 % IV SOLN
1.0000 g | Freq: Once | INTRAVENOUS | Status: AC
  Administered 2022-08-31: 1 g via INTRAVENOUS
  Filled 2022-08-31: qty 10

## 2022-08-31 MED ORDER — POTASSIUM CHLORIDE 10 MEQ/100ML IV SOLN
10.0000 meq | INTRAVENOUS | Status: AC
  Administered 2022-08-31 (×2): 10 meq via INTRAVENOUS
  Filled 2022-08-31 (×2): qty 100

## 2022-08-31 MED ORDER — HYDROCODONE-ACETAMINOPHEN 5-325 MG PO TABS: 1.0000 | ORAL_TABLET | Freq: Four times a day (QID) | ORAL | 0 refills | Status: DC | PRN

## 2022-08-31 NOTE — ED Triage Notes (Signed)
Pt arrived via EMS, from home, c/o left flank pain. Recently dx with kidney stone, had passed it and started having pain again, feels the same as the last time she had a stone.

## 2022-08-31 NOTE — ED Notes (Signed)
Nurse check micromedex rocephin and potassium compatible

## 2022-08-31 NOTE — Discharge Instructions (Signed)
Take Keflex antibiotics three times per day for 10 days.  Take Vicodin every six hours as needed for pain.  Take Potassium tablets one per day  Take Flomax capsules one per day for 7 days

## 2022-08-31 NOTE — ED Provider Notes (Signed)
[place] EMERGENCY DEPARTMENT AT [facility] Provider Note   CSN: [id number] Arrival date & time: 08/31/22  1356     History  Chief Complaint  Patient presents with   Flank Pain    Tonya Mcintosh is a 57 y.o. female.  Patient is a 57 year old woman with a history of a kidney stone last month, presenting with left flank pain. Patient reports this afternoon she began to experience sharp, nonradiating pain in her left flank. She states it is the same as she had during her last experience with a kidney stone. She has had nausea without vomiting. Denies dysuria, hematuria, fever, chills.    Flank Pain Pertinent negatives include no abdominal pain.      Home Medications Prior to Admission medications   Medication Sig Start Date End Date Taking? Authorizing Provider  cephALEXin (KEFLEX) 500 MG capsule Take 1 capsule (500 mg total) by mouth 3 (three) times daily for 10 days. 08/31/22 09/10/22 Yes Monna Fam, MD  HYDROcodone-acetaminophen (NORCO/VICODIN) 5-325 MG tablet Take 1 tablet by mouth every 6 (six) hours as needed. 08/31/22  Yes Monna Fam, MD  potassium chloride SA (KLOR-CON M) 20 MEQ tablet Take 1 tablet (20 mEq total) by mouth once for 1 dose. 08/31/22 08/31/22 Yes Monna Fam, MD  tamsulosin (FLOMAX) 0.4 MG CAPS capsule Take 1 capsule (0.4 mg total) by mouth daily for 7 days. 08/31/22 09/07/22 Yes Monna Fam, MD  acetaminophen (TYLENOL) 500 MG tablet Take 1 tablet (500 mg total) by mouth every 6 (six) hours as needed for pain. Patient taking differently: Take 1,000 mg by mouth every 6 (six) hours as needed for pain. 10/15/12   Fayrene Helper, PA-C  enoxaparin (LOVENOX) 40 MG/0.4ML injection Inject 0.6 mLs (60 mg total) into the skin every 12 (twelve) hours for 7 days. 04/17/22 04/24/22  Gowens, Mariah L, PA-C  metroNIDAZOLE (FLAGYL) 500 MG tablet Take 1 tablet (500 mg total) by mouth 2 (two) times daily. 08/03/22   Rolan Bucco, MD  traMADol-acetaminophen (ULTRACET) 37.5-325  MG tablet Take 1 tablet by mouth every 6 (six) hours as needed.    [provider]  warfarin (COUMADIN) 3 MG tablet TAKE 1-2 TABLETS BY MOUTH DAILY OR AS DIRECTED BY CLINIC; Must make 7/10 appt. 08/30/22   Tobb, Kardie, DO      Allergies    Codeine    Review of Systems   Review of Systems  Constitutional:  Negative for chills and fever.  Gastrointestinal:  Positive for nausea. Negative for abdominal pain and vomiting.  Genitourinary:  Positive for flank pain. Negative for dysuria.    Physical Exam Updated Vital Signs BP (!) 136/95   Pulse 74   Temp 97.9 F (36.6 C) (Oral)   Resp 17   SpO2 100%  Physical Exam Constitutional:      General: She is in acute distress.  Cardiovascular:     Rate and Rhythm: Normal rate and regular rhythm.     Heart sounds: Normal heart sounds.  Pulmonary:     Effort: Pulmonary effort is normal.     Breath sounds: Normal breath sounds.  Abdominal:     General: There is no distension.     Palpations: Abdomen is soft.     Tenderness: There is no abdominal tenderness.     Comments: Left flank tender to palpation. Patient in distress  Neurological:     General: No focal deficit present.     Mental Status: She is oriented to person,  place, and time.     ED Results / Procedures / Treatments   Labs (all labs ordered are listed, but only abnormal results are displayed) Labs Reviewed  COMPREHENSIVE METABOLIC PANEL - Abnormal; Notable for the following components:      Result Value   Potassium 2.8 (*)    Calcium 8.7 (*)    Total Protein 8.6 (*)    All other components within normal limits  URINALYSIS, W/ REFLEX TO CULTURE (INFECTION SUSPECTED) - Abnormal; Notable for the following components:   APPearance HAZY (*)    Hgb urine dipstick MODERATE (*)    Leukocytes,Ua SMALL (*)    Bacteria, UA MANY (*)    All other components within normal limits  URINE CULTURE  CBC WITH DIFFERENTIAL/PLATELET  LIPASE, BLOOD     EKG None  Radiology CT Renal Stone Study  Result Date: 08/31/2022 CLINICAL DATA:  Abdominal/flank pain, stone suspected EXAM: CT ABDOMEN AND PELVIS WITHOUT CONTRAST TECHNIQUE: Multidetector CT imaging of the abdomen and pelvis was performed following the standard protocol without IV contrast. RADIATION DOSE REDUCTION: This exam was performed according to the departmental dose-optimization program which includes automated exposure control, adjustment of the mA and/or kV according to patient size and/or use of iterative reconstruction technique. COMPARISON:  CT 04/17/2022 FINDINGS: Lower chest: No basilar airspace disease or pleural effusion. Hepatobiliary: Borderline hepatic steatosis. No evidence of focal liver abnormality. Gallbladder physiologically distended, no calcified stone. No biliary dilatation. Pancreas: No ductal dilatation or inflammation. Spleen: Normal in size without focal abnormality. Adrenals/Urinary Tract: No adrenal nodule. No hydronephrosis or perinephric edema. Right intrarenal calculi, largest in the upper pole measuring 3 mm. Subcentimeter hypodensity in the mid right kidney is incompletely characterized on this unenhanced exam, no further imaging follow-up is needed. Punctate nonobstructing stone in the upper left kidney. The ureters are decompressed. No ureteral stone. Urinary bladder is unremarkable, no bladder stone. Stomach/Bowel: Physiologically distended stomach no bowel obstruction or inflammation. Normal appendix. Mild colonic diverticulosis without diverticulitis. Vascular/Lymphatic: Normal caliber abdominal aorta. Mild atherosclerosis. No bulky adenopathy. Reproductive: Heterogeneous enlarged uterus with multiple fibroids, included in exophytic 9.8 cm fibroid in the left fundus. Some of these fibroids are calcified. The ovaries are not well seen. Other: No ascites or free air. Musculoskeletal: There are no acute or suspicious osseous abnormalities. Chronic changes at  L3-L4 with endplate sclerosis, stable from prior. IMPRESSION: 1. Bilateral nonobstructing renal calculi. No hydronephrosis or obstructive uropathy. 2. Colonic diverticulosis without diverticulitis. 3. Fibroid uterus. Electronically Signed   By: Narda Rutherford M.D.   On: 08/31/2022 19:37    Procedures Procedures    Medications Ordered in ED Medications  potassium chloride 10 mEq in 100 mL IVPB (10 mEq Intravenous New Bag/Given 08/31/22 2036)  cefTRIAXone (ROCEPHIN) 1 g in sodium chloride 0.9 % 100 mL IVPB (1 g Intravenous New Bag/Given 08/31/22 2045)  ketorolac (TORADOL) 30 MG/ML injection 30 mg (30 mg Intramuscular Given 08/31/22 1816)  potassium chloride SA (KLOR-CON M) CR tablet 40 mEq (40 mEq Oral Given 08/31/22 1854)  ondansetron (ZOFRAN) injection 4 mg (4 mg Intravenous Given 08/31/22 1853)    ED Course/ Medical Decision Making/ A&P                             Medical Decision Making Medical Decision Making:   JAMEKA IVIE is a 57 y.o. female who presented to the ED today with flank pain detailed above.    Patient's presentation is  complicated by their history of nephrolithiasis.  Complete initial physical exam performed, notably the patient had CVA tenderness.    Reviewed and confirmed nursing documentation for past medical history, family history, social history.    Initial Assessment:   With the patient's presentation of flank pain, most likely diagnosis is UTI. Other diagnoses were considered including (but not limited to) uncomplicated nephrolithiasis, renal infarction, pancreatitis, ovarian cyst, AAA. These are considered less likely due to history of present illness and physical exam findings.    Initial Plan:   Screening labs including CBC and Metabolic panel to evaluate for infectious or metabolic etiology of disease.  Urinalysis with reflex culture ordered to evaluate for UTI or relevant urologic/nephrologic pathology.  CT Renal study to evaluate for  nephrolithiasis Objective evaluation as below reviewed   Initial Study Results:   Laboratory  All laboratory results reviewed without evidence of clinically relevant pathology.  Exception: Potassium: 2.8, UA positive for bacteria and small leukocytes   Radiology:  All images reviewed independently. Agree with radiology report at this time.   CT Renal Stone Study  Result Date: 08/31/2022 CLINICAL DATA:  Abdominal/flank pain, stone suspected EXAM: CT ABDOMEN AND PELVIS WITHOUT CONTRAST TECHNIQUE: Multidetector CT imaging of the abdomen and pelvis was performed following the standard protocol without IV contrast. RADIATION DOSE REDUCTION: This exam was performed according to the departmental dose-optimization program which includes automated exposure control, adjustment of the mA and/or kV according to patient size and/or use of iterative reconstruction technique. COMPARISON:  CT 04/17/2022 FINDINGS: Lower chest: No basilar airspace disease or pleural effusion. Hepatobiliary: Borderline hepatic steatosis. No evidence of focal liver abnormality. Gallbladder physiologically distended, no calcified stone. No biliary dilatation. Pancreas: No ductal dilatation or inflammation. Spleen: Normal in size without focal abnormality. Adrenals/Urinary Tract: No adrenal nodule. No hydronephrosis or perinephric edema. Right intrarenal calculi, largest in the upper pole measuring 3 mm. Subcentimeter hypodensity in the mid right kidney is incompletely characterized on this unenhanced exam, no further imaging follow-up is needed. Punctate nonobstructing stone in the upper left kidney. The ureters are decompressed. No ureteral stone. Urinary bladder is unremarkable, no bladder stone. Stomach/Bowel: Physiologically distended stomach no bowel obstruction or inflammation. Normal appendix. Mild colonic diverticulosis without diverticulitis. Vascular/Lymphatic: Normal caliber abdominal aorta. Mild atherosclerosis. No bulky  adenopathy. Reproductive: Heterogeneous enlarged uterus with multiple fibroids, included in exophytic 9.8 cm fibroid in the left fundus. Some of these fibroids are calcified. The ovaries are not well seen. Other: No ascites or free air. Musculoskeletal: There are no acute or suspicious osseous abnormalities. Chronic changes at L3-L4 with endplate sclerosis, stable from prior. IMPRESSION: 1. Bilateral nonobstructing renal calculi. No hydronephrosis or obstructive uropathy. 2. Colonic diverticulosis without diverticulitis. 3. Fibroid uterus. Electronically Signed   By: Narda Rutherford M.D.   On: 08/31/2022 19:37    Reassessment and Plan:   Patient is a 57 year old woman with history significant for a kidney stone last month presenting to the ED with left flank pain. Physical exam is significant for left CVA tenderness. Labs are significant for Potassium: 2.8 and a UA positive for bacteria and leukocytes.  CT Renal stone study shows bilateral nonobstructing renal calculi. Patient was given potassium repletion, toradol for pain management, zofran for nausea. Given patient's nonobstructing kidney stones, patient was given Rocephin while in the ED for treatment of UTI. On reassessment, patient was resting in bed in decreased discomfort. Patient was informed of lab and imaging results and agreed with plan to be discharged. Patient was  discharged with prescriptions for potassium, Keflex, Flomax and Vicodin.           Final Clinical Impression(s) / ED Diagnoses Final diagnoses:  Flank pain  Kidney stone    Rx / DC Orders ED Discharge Orders          Ordered    cephALEXin (KEFLEX) 500 MG capsule  3 times daily        08/31/22 2054    potassium chloride SA (KLOR-CON M) 20 MEQ tablet   Once        08/31/22 2054    HYDROcodone-acetaminophen (NORCO/VICODIN) 5-325 MG tablet  Every 6 hours PRN        08/31/22 2054    tamsulosin (FLOMAX) 0.4 MG CAPS capsule  Daily        08/31/22 2054               Monna Fam, MD 08/31/22 2106    Charlynne Pander, MD 08/31/22 [phone]

## 2022-08-31 NOTE — ED Provider Triage Note (Signed)
Emergency Medicine Provider Triage Evaluation Note  Tonya Mcintosh , a 57 y.o. female  was evaluated in triage.  Pt complains of  left side pain that started 2:30 PM this afternoon. Denies hematuria, dysuria, abdominal pain. No fevers or chills. Reports a kidney stone a couple of weeks ago that did not require surgical intervention.   Review of Systems  Positive: As above Negative: As above  Physical Exam  BP (!) 164/80 (BP Location: Left Arm)   Pulse 80   Temp 97.7 F (36.5 C) (Oral)   Resp 18   SpO2 100%  Gen:   Awake, uncomfortable appearing, hunched over Resp:  Normal effort  MSK:   Moves extremities without difficulty  Other:    Medical Decision Making  Medically screening exam initiated at 3:06 PM.  Appropriate orders placed.  Janet Berlin was informed that the remainder of the evaluation will be completed by another provider, this initial triage assessment does not replace that evaluation, and the importance of remaining in the ED until their evaluation is complete.     Arabella Merles, PA-C 08/31/22 [phone]

## 2022-09-01 ENCOUNTER — Ambulatory Visit: Payer: Medicaid Other

## 2022-09-02 LAB — URINE CULTURE: Culture: >=100000 — AB

## 2022-09-03 ENCOUNTER — Telehealth (HOSPITAL_BASED_OUTPATIENT_CLINIC_OR_DEPARTMENT_OTHER): Payer: Self-pay

## 2022-09-03 NOTE — Telephone Encounter (Signed)
Post ED Visit - Positive Culture Follow-up  Culture report reviewed by antimicrobial stewardship pharmacist: [facility] []  Enzo Bi, Pharm.D. []  Celedonio Miyamoto, Pharm.D., BCPS AQ-ID []  Garvin Fila, Pharm.D., BCPS [x]  Georgina Pillion, Pharm.D., BCPS []  [place], [address].D., BCPS, AAHIVP []  Estella Husk, Pharm.D., BCPS, AAHIVP []  Lysle Pearl, PharmD, BCPS []  Phillips Climes, PharmD, BCPS []  Agapito Games, PharmD, BCPS []  Verlan Friends, PharmD []  Mervyn Gay, PharmD, BCPS []  Vinnie Level, PharmD  [facility] []  Len Childs, PharmD []  Greer Pickerel, PharmD []  Adalberto Cole, PharmD []  Perlie Gold, Rph []  Lonell Face) Jean Rosenthal, PharmD []  Earl Many, PharmD []  Junita Push, PharmD []  Dorna Leitz, PharmD []  Terrilee Files, PharmD []  Lynann Beaver, PharmD []  Keturah Barre, PharmD []  Loralee Pacas, PharmD []  Bernadene Person, PharmD   Positive urine culture Treated with Cephalexin, organism sensitive to the same and no further patient follow-up is required at this time.  Sandria Senter 09/03/2022, 9:35 AM

## 2022-09-07 ENCOUNTER — Ambulatory Visit: Payer: Medicaid Other

## 2022-09-08 ENCOUNTER — Ambulatory Visit: Payer: Medicaid Other | Attending: Internal Medicine

## 2022-09-08 DIAGNOSIS — Z5181 Encounter for therapeutic drug level monitoring: Secondary | ICD-10-CM | POA: Diagnosis not present

## 2022-09-08 DIAGNOSIS — I4891 Unspecified atrial fibrillation: Secondary | ICD-10-CM | POA: Diagnosis not present

## 2022-09-08 LAB — POCT INR: INR: 2.1 (ref 2.0–3.0)

## 2022-09-08 NOTE — Patient Instructions (Signed)
Description   Take 1 tablet today and then continue taking 1 tablet daily EXCEPT 0.5 tablet on Mondays, Wednesdays, and Fridays.  Stay consistent with greens each week (1 per week);  Recheck INR in 2 weeks.  [facility] [phone]

## 2022-09-09 ENCOUNTER — Ambulatory Visit: Payer: Medicaid Other

## 2022-09-10 ENCOUNTER — Other Ambulatory Visit: Payer: Self-pay | Admitting: Cardiology

## 2022-09-10 DIAGNOSIS — I4891 Unspecified atrial fibrillation: Secondary | ICD-10-CM

## 2022-09-22 ENCOUNTER — Ambulatory Visit: Payer: Medicaid Other

## 2022-09-24 ENCOUNTER — Ambulatory Visit: Payer: Medicaid Other

## 2022-09-24 DIAGNOSIS — Z5181 Encounter for therapeutic drug level monitoring: Secondary | ICD-10-CM

## 2022-09-24 DIAGNOSIS — I4891 Unspecified atrial fibrillation: Secondary | ICD-10-CM

## 2022-09-24 LAB — POCT INR: INR: 2.7 (ref 2.0–3.0)

## 2022-09-24 MED ORDER — WARFARIN SODIUM 3 MG PO TABS
ORAL_TABLET | ORAL | 0 refills | Status: DC
Start: 2022-09-24 — End: 2022-10-11

## 2022-09-24 NOTE — Patient Instructions (Signed)
continue taking 1 tablet daily EXCEPT 0.5 tablet on Mondays, Wednesdays, and Fridays.  Stay consistent with greens each week (1 per week);  Recheck INR in 4 weeks.  [facility] [phone]

## 2022-10-11 ENCOUNTER — Other Ambulatory Visit: Payer: Self-pay | Admitting: Cardiology

## 2022-10-11 DIAGNOSIS — I4891 Unspecified atrial fibrillation: Secondary | ICD-10-CM

## 2022-10-22 ENCOUNTER — Telehealth: Payer: Self-pay | Admitting: *Deleted

## 2022-10-22 ENCOUNTER — Ambulatory Visit: Payer: Medicaid Other | Attending: Cardiology

## 2022-10-22 NOTE — Telephone Encounter (Signed)
Called pt since she missed her Anticoagulation Appt. There was no answer so left a message for pt to call back to reschedule and called her dtr and her voicemail is full.

## 2022-11-01 ENCOUNTER — Ambulatory Visit: Payer: Medicaid Other

## 2022-11-03 ENCOUNTER — Ambulatory Visit: Payer: Medicaid Other | Attending: Internal Medicine

## 2022-11-03 DIAGNOSIS — I4891 Unspecified atrial fibrillation: Secondary | ICD-10-CM | POA: Diagnosis not present

## 2022-11-03 DIAGNOSIS — Z5181 Encounter for therapeutic drug level monitoring: Secondary | ICD-10-CM

## 2022-11-03 LAB — POCT INR: INR: 2.3 (ref 2.0–3.0)

## 2022-11-03 NOTE — Patient Instructions (Signed)
Description   Take 1 tablet today and then continue taking 1 tablet daily EXCEPT 0.5 tablet on Mondays, Wednesdays, and Fridays.  Stay consistent with greens each week (1 per week);  Recheck INR in 3 weeks.  [facility]in Clinic [phone]

## 2022-11-24 ENCOUNTER — Ambulatory Visit: Payer: Medicaid Other

## 2022-11-29 ENCOUNTER — Ambulatory Visit: Payer: Medicaid Other | Attending: Cardiology

## 2022-11-29 DIAGNOSIS — I4891 Unspecified atrial fibrillation: Secondary | ICD-10-CM | POA: Diagnosis not present

## 2022-11-29 DIAGNOSIS — Z5181 Encounter for therapeutic drug level monitoring: Secondary | ICD-10-CM

## 2022-11-29 LAB — POCT INR: INR: 3.2 — AB (ref 2.0–3.0)

## 2022-11-29 NOTE — Patient Instructions (Signed)
continue taking 1 tablet daily EXCEPT 0.5 tablet on Mondays, Wednesdays, and Fridays.  Stay consistent with greens each week (1 per week);  Recheck INR in 4 weeks.  [facility] [phone]

## 2022-12-04 ENCOUNTER — Other Ambulatory Visit: Payer: Self-pay | Admitting: Cardiology

## 2022-12-04 DIAGNOSIS — I4891 Unspecified atrial fibrillation: Secondary | ICD-10-CM

## 2022-12-10 ENCOUNTER — Ambulatory Visit: Payer: Medicaid Other | Attending: Cardiology | Admitting: Cardiology

## 2022-12-27 ENCOUNTER — Ambulatory Visit: Payer: Medicaid Other

## 2022-12-31 ENCOUNTER — Ambulatory Visit: Payer: Medicaid Other

## 2023-01-03 ENCOUNTER — Ambulatory Visit: Payer: Medicaid Other | Attending: Cardiology | Admitting: *Deleted

## 2023-01-03 DIAGNOSIS — Z5181 Encounter for therapeutic drug level monitoring: Secondary | ICD-10-CM | POA: Diagnosis not present

## 2023-01-03 DIAGNOSIS — I4891 Unspecified atrial fibrillation: Secondary | ICD-10-CM | POA: Diagnosis not present

## 2023-01-03 LAB — POCT INR: INR: 2.9 (ref 2.0–3.0)

## 2023-01-03 NOTE — Patient Instructions (Signed)
Description   Continue taking 1 tablet daily EXCEPT 1/2 tablet on Mondays, Wednesdays, and Fridays.  Stay consistent with greens each week (1 per week);  Recheck INR in 5 weeks.  [facility]in Clinic [phone]

## 2023-01-06 ENCOUNTER — Other Ambulatory Visit: Payer: Self-pay | Admitting: Cardiology

## 2023-01-06 DIAGNOSIS — I4891 Unspecified atrial fibrillation: Secondary | ICD-10-CM

## 2023-01-06 NOTE — Telephone Encounter (Signed)
Warfarin 3mg  refill Afib Last INR 01/03/23 Last OV 06/23/21-needs appt, PLACED NOTE ON NEXT ANTICOAG APPT

## 2023-02-05 ENCOUNTER — Other Ambulatory Visit: Payer: Self-pay | Admitting: Cardiology

## 2023-02-05 DIAGNOSIS — I4891 Unspecified atrial fibrillation: Secondary | ICD-10-CM

## 2023-02-07 ENCOUNTER — Ambulatory Visit: Payer: Medicaid Other

## 2023-02-07 NOTE — Telephone Encounter (Signed)
Pt missed coumadin clinic appt. Called and rescheduled appt for 02/18/23. Pt states she does not need a refill at this time.

## 2023-02-18 ENCOUNTER — Ambulatory Visit: Payer: Medicaid Other

## 2023-03-01 ENCOUNTER — Ambulatory Visit: Payer: Medicaid Other | Attending: Cardiology

## 2023-03-01 DIAGNOSIS — I4891 Unspecified atrial fibrillation: Secondary | ICD-10-CM

## 2023-03-01 DIAGNOSIS — Z5181 Encounter for therapeutic drug level monitoring: Secondary | ICD-10-CM | POA: Diagnosis not present

## 2023-03-01 LAB — POCT INR: INR: 2.1 (ref 2.0–3.0)

## 2023-03-01 MED ORDER — WARFARIN SODIUM 3 MG PO TABS
ORAL_TABLET | ORAL | 1 refills | Status: DC
Start: 2023-03-01 — End: 2023-05-05

## 2023-03-01 NOTE — Patient Instructions (Signed)
 TAKE 2 TABLETS TODAY ONLY THEN Continue taking 1 tablet daily EXCEPT 1/2 tablet on Mondays, Wednesdays, and Fridays.  Stay consistent with greens each week (1 per week);  Recheck INR in 4 weeks.  [facility]in Clinic [phone]

## 2023-03-29 ENCOUNTER — Ambulatory Visit: Payer: Medicaid Other

## 2023-03-31 ENCOUNTER — Ambulatory Visit: Payer: Medicaid Other

## 2023-04-05 ENCOUNTER — Ambulatory Visit: Payer: Medicaid Other

## 2023-04-07 ENCOUNTER — Ambulatory Visit: Payer: Medicaid Other

## 2023-04-11 ENCOUNTER — Ambulatory Visit: Payer: Medicaid Other | Attending: Cardiovascular Disease

## 2023-04-11 DIAGNOSIS — I4891 Unspecified atrial fibrillation: Secondary | ICD-10-CM

## 2023-04-11 DIAGNOSIS — Z5181 Encounter for therapeutic drug level monitoring: Secondary | ICD-10-CM

## 2023-04-11 LAB — POCT INR: INR: 2.1 (ref 2.0–3.0)

## 2023-04-11 NOTE — Patient Instructions (Signed)
TAKE 1.5 TABLETS TODAY ONLY THEN Continue taking 1 tablet daily EXCEPT 1/2 tablet on Mondays, Wednesdays, and Fridays.  Recheck INR in 3 weeks.  C[facility]in Clinic [phone]

## 2023-05-02 ENCOUNTER — Ambulatory Visit: Payer: Medicaid Other

## 2023-05-05 ENCOUNTER — Ambulatory Visit: Attending: Cardiology

## 2023-05-05 DIAGNOSIS — I4891 Unspecified atrial fibrillation: Secondary | ICD-10-CM | POA: Diagnosis not present

## 2023-05-05 LAB — POCT INR: INR: 2.5 (ref 2.0–3.0)

## 2023-05-05 MED ORDER — WARFARIN SODIUM 3 MG PO TABS
ORAL_TABLET | ORAL | 1 refills | Status: DC
Start: 2023-05-05 — End: 2023-11-16

## 2023-05-05 NOTE — Patient Instructions (Signed)
 Description   Take 1.5 tablets today and then continue taking 1 tablet daily EXCEPT 1/2 tablet on Mondays, Wednesdays, and Fridays.  Recheck INR in 4 weeks.  C[facility]in Clinic [phone]

## 2023-06-02 ENCOUNTER — Ambulatory Visit: Attending: Cardiology | Admitting: *Deleted

## 2023-06-02 DIAGNOSIS — I4891 Unspecified atrial fibrillation: Secondary | ICD-10-CM

## 2023-06-02 LAB — POCT INR: POC INR: 2.3

## 2023-06-02 NOTE — Patient Instructions (Signed)
 Description   Take 1.5 Tablets of Warfarin today START taking 1 tablet daily EXCEPT 1/2 tablet on Mondays and Fridays.  Recheck INR in 3 weeks.  C[facility]madin Clinic [phone]     1st Floor: - Lobby - Registration  - Pharmacy  - Lab - Cafe  2nd Floor: - PV Lab - Diagnostic Testing (echo, CT, nuclear med)  3rd Floor: - Vacant  4th Floor: - TCTS (cardiothoracic surgery) - AFib Clinic - Structural Heart Clinic - Vascular Surgery  - Vascular Ultrasound  5th Floor: - [facility] (general and EP) - Clinical Pharmacy for coumadin, hypertension, lipid, weight-loss medications, and med management appointments    Valet parking services will be available as well.

## 2023-06-23 ENCOUNTER — Ambulatory Visit: Attending: Cardiology | Admitting: *Deleted

## 2023-06-23 DIAGNOSIS — I4891 Unspecified atrial fibrillation: Secondary | ICD-10-CM

## 2023-06-23 DIAGNOSIS — Z5181 Encounter for therapeutic drug level monitoring: Secondary | ICD-10-CM | POA: Diagnosis present

## 2023-06-23 LAB — POCT INR: POC INR: 2.6

## 2023-06-23 NOTE — Patient Instructions (Signed)
 Description   Continue taking 1 tablet daily EXCEPT 1/2 tablet on Mondays and Fridays.  Recheck INR in 4 weeks.  Coumadin  Clinic [phone]

## 2023-07-21 ENCOUNTER — Ambulatory Visit: Attending: Cardiology

## 2023-08-05 ENCOUNTER — Ambulatory Visit: Admitting: Cardiology

## 2023-09-08 ENCOUNTER — Other Ambulatory Visit (HOSPITAL_COMMUNITY): Payer: Self-pay

## 2023-09-08 ENCOUNTER — Ambulatory Visit: Attending: Cardiology | Admitting: Cardiology

## 2023-09-08 ENCOUNTER — Encounter: Payer: Self-pay | Admitting: Cardiology

## 2023-09-08 VITALS — BP 150/92 | HR 64 | Ht 60.0 in | Wt 143.2 lb

## 2023-09-08 DIAGNOSIS — Z952 Presence of prosthetic heart valve: Secondary | ICD-10-CM | POA: Diagnosis present

## 2023-09-08 DIAGNOSIS — I10 Essential (primary) hypertension: Secondary | ICD-10-CM | POA: Insufficient documentation

## 2023-09-08 DIAGNOSIS — Z8774 Personal history of (corrected) congenital malformations of heart and circulatory system: Secondary | ICD-10-CM | POA: Insufficient documentation

## 2023-09-08 DIAGNOSIS — I4891 Unspecified atrial fibrillation: Secondary | ICD-10-CM | POA: Diagnosis present

## 2023-09-08 DIAGNOSIS — Z9889 Other specified postprocedural states: Secondary | ICD-10-CM | POA: Diagnosis present

## 2023-09-08 DIAGNOSIS — I77819 Aortic ectasia, unspecified site: Secondary | ICD-10-CM | POA: Insufficient documentation

## 2023-09-08 MED ORDER — BLOOD PRESSURE MONITOR AUTOMAT DEVI
1.0000 [IU] | Freq: Once | 0 refills | Status: AC
  Filled 2023-09-08: qty 1, 30d supply, fill #0

## 2023-09-08 MED ORDER — AMLODIPINE BESYLATE 5 MG PO TABS
5.0000 mg | ORAL_TABLET | Freq: Every day | ORAL | 3 refills | Status: DC
  Filled 2023-09-08: qty 90, 90d supply, fill #0

## 2023-09-08 NOTE — Patient Instructions (Signed)
 Medication Instructions:  Your physician has recommended you make the following change in your medication:  START: Amlodipine  5 mg once daily *If you need a refill on your cardiac medications before your next appointment, please call your pharmacy*   Testing/Procedures: Your physician has requested that you have an echocardiogram. Echocardiography is a painless test that uses sound waves to create images of your heart. It provides your doctor with information about the size and shape of your heart and how well your heart's chambers and valves are working. This procedure takes approximately one hour. There are no restrictions for this procedure. Please do NOT wear cologne, perfume, aftershave, or lotions (deodorant is allowed). Please arrive 15 minutes prior to your appointment time.  Please note: We ask at that you not bring children with you during ultrasound (echo/ vascular) testing. Due to room size and safety concerns, children are not allowed in the ultrasound rooms during exams. Our front office staff cannot provide observation of children in our lobby area while testing is being conducted. An adult accompanying a patient to their appointment will only be allowed in the ultrasound room at the discretion of the ultrasound technician under special circumstances. We apologize for any inconvenience.   Follow-Up: At [facility], you and your health needs are our priority.  As part of our continuing mission to provide you with exceptional heart care, our providers are all part of one team.  This team includes your primary Cardiologist (physician) and Advanced Practice Providers or APPs (Physician Assistants and Nurse Practitioners) who all work together to provide you with the care you need, when you need it.  Your next appointment:   1 year(s)  Provider:   Kardie Tobb, DO    We recommend signing up for the patient portal called MyChart.  Sign up information is provided on this  After Visit Summary.  MyChart is used to connect with patients for Virtual Visits (Telemedicine).  Patients are able to view lab/test results, encounter notes, upcoming appointments, etc.  Non-urgent messages can be sent to your provider as well.   To learn more about what you can do with MyChart, go to [email or web address].   Other Instructions Please see our pharmacist in 4 weeks.

## 2023-09-10 NOTE — Progress Notes (Signed)
 Cardiology Office Note:    Date:  09/10/2023   ID:  Tonya Mcintosh, DOB [birth year], MRN [id number]  PCP:  Elizbeth Leita Ruth, FNP  Cardiologist:  Dub Huntsman, DO  Electrophysiologist:  None   Referring MD: Elizbeth Leita Ruth, *      History of Present Illness:    Tonya Mcintosh is a 58 y.o. 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 Jude's bileaflet mechanical valve, hyperlipidemia.   I saw her for the first time in 06/2021. She was doing well. I advised for her to continue to follow the coumadin  clinic and repeated an echo. She did not follow up as recommended.   She is her today for a visit. She offers no complaints.    Past Medical History:  Diagnosis Date   Abnormal vaginal Pap smear    CIN I   Aortic valve replaced 1987; September 2010   Endocarditis 2006   Treated w/ antibiotics long term, doing well since.   H/O insomnia    Hypercholesterolemia    Hyperlipidemia    Kidney calculi    Kidney infection    VSD (ventricular septal defect) 1979   repair    Past Surgical History:  Procedure Laterality Date   AORTIC VALVE REPLACEMENT  07/17/1985   23-mm Medtronic Hall prosthesis   CARDIAC CATHETERIZATION  01/25/1985   Preop 10 2010, no significant CAD.   CARDIOVASCULAR STRESS TEST  09/22/2008   Fixed defect at the apex. No stress induced ischemia.   MECHANICAL AORTIC VALVE REPLACEMENT  October 2010   Medtronic prosthetic valve exchanged for a St. Jude tactile prosthesis (bileaflet)   TRANSTHORACIC ECHOCARDIOGRAM  12/25/2008   EF >55%, doppler evidence of regurg is probably normal of St Jude prosthetic aortic valve.   TRANSTHORACIC ECHOCARDIOGRAM  03/19/2013   EF 60-65%; ? Grade 3  diastolic dysfunction; Mech Prosthesis of AoV -mild-mod AI. Mildly increased gradient.   TUBAL LIGATION  1986   VSD REPAIR  02-22-77    Current Medications: Current Meds  Medication Sig   acetaminophen  (TYLENOL ) 500 MG tablet Take 1 tablet (500 mg total) by mouth every 6 (six) hours as needed for pain.   amLODipine  (NORVASC ) 5 MG tablet Take 1 tablet (5 mg total) by mouth daily.   [EXPIRED] Blood Pressure Monitoring (BLOOD PRESSURE MONITOR AUTOMAT) DEVI Use to monitor blood pressure as directed.   enoxaparin  (LOVENOX ) 40 MG/0.4ML injection Inject 0.6 mLs (60 mg total) into the skin every 12 (twelve) hours for 7 days.   warfarin (COUMADIN ) 3 MG tablet TAKE 1/2 TABLET TO 1 TABLET BY MOUTH DAILY OR AS DIRECTED BY CLINIC;     Allergies:   Codeine   Social History   Socioeconomic History   Marital status: Married    Spouse name: Not on file   Number of children: Not on file   Years of education: Not on file   Highest education level: Not on file  Occupational History   Not on file  Tobacco Use   Smoking status: Never   Smokeless tobacco: Never  Substance and Sexual Activity   Alcohol use: No   Drug use: No   Sexual activity: Yes    Partners: Male  Other Topics Concern  Not on file  Social History Narrative   Lives with ex-husband and children in [place]. No smoking, no etoh, disability   Social Drivers of Corporate investment banker Strain: Not on file  Food Insecurity: Not on file  Transportation Needs: Not on file  Physical Activity: Not on file  Stress: Not on file  Social Connections: Unknown (06/24/2021)   Received from [facility]   Social Network    Social Network: Not on file     Family History: The patient's family history includes Asthma in her sister.  ROS:   Review of Systems  Constitution: Negative for decreased appetite, fever and weight gain.  HENT: Negative for congestion, ear discharge, hoarse voice and sore throat.   Eyes: Negative for discharge,  redness, vision loss in right eye and visual halos.  Cardiovascular: Negative for chest pain, dyspnea on exertion, leg swelling, orthopnea and palpitations.  Respiratory: Negative for cough, hemoptysis, shortness of breath and snoring.   Endocrine: Negative for heat intolerance and polyphagia.  Hematologic/Lymphatic: Negative for bleeding problem. Does not bruise/bleed easily.  Skin: Negative for flushing, nail changes, rash and suspicious lesions.  Musculoskeletal: Negative for arthritis, joint pain, muscle cramps, myalgias, neck pain and stiffness.  Gastrointestinal: Negative for abdominal pain, bowel incontinence, diarrhea and excessive appetite.  Genitourinary: Negative for decreased libido, genital sores and incomplete emptying.  Neurological: Negative for brief paralysis, focal weakness, headaches and loss of balance.  Psychiatric/Behavioral: Negative for altered mental status, depression and suicidal ideas.  Allergic/Immunologic: Negative for HIV exposure and persistent infections.    EKGs/Labs/Other Studies Reviewed:    The following studies were reviewed today:   EKG:  The ekg ordered today demonstrates   Recent Labs: No results found for requested labs within last 365 days.  Recent Lipid Panel    Component Value Date/Time   CHOL 179 03/28/2009 0914   TRIG 53.0 03/28/2009 0914   HDL 52.60 03/28/2009 0914   CHOLHDL 3 03/28/2009 0914   VLDL 10.6 03/28/2009 0914   LDLCALC 116 (H) 03/28/2009 0914   LDLDIRECT 129.3 02/09/2008 0939    Physical Exam:    VS:  BP (!) 150/92   Pulse 64   Ht 5' (1.524 m)   Wt 143 lb 3.2 oz (65 kg)   SpO2 96%   BMI 27.97 kg/m     Wt Readings from Last 3 Encounters:  09/08/23 143 lb 3.2 oz (65 kg)  07/05/22 144 lb (65.3 kg)  04/17/22 140 lb (63.5 kg)     GEN: Well nourished, well developed in no acute distress HEENT: Normal NECK: No JVD; No carotid bruits LYMPHATICS: No lymphadenopathy CARDIAC: S1S2 noted,RRR, no murmurs, rubs,  gallops RESPIRATORY:  Clear to auscultation without rales, wheezing or rhonchi  ABDOMEN: Soft, non-tender, non-distended, +bowel sounds, no guarding. EXTREMITIES: No edema, No cyanosis, no clubbing MUSCULOSKELETAL:  No deformity  SKIN: Warm and dry NEUROLOGIC:  Alert and oriented x 3, non-focal PSYCHIATRIC:  Normal affect, good insight  ASSESSMENT:    1. S/P VSD closure   2. Hx o post operative afib   3. Hypertension, unspecified type   4. Status post aortic valve repair   5. H/O mechanical aortic valve replacement   6. Aortic dilatation (HCC)    PLAN:    Hx of mechanical aortic valve - she follows with our coumadin  clinic.   She is status post VSD closure, recent echo 2023 , need a repeat echo this is overdue because of her not following up as recommend -  will schedule   She is hypertensive in the office today - review trend and her blood pressure has been trending to the hypertensive side.  Will start amlodipine  5 mg daily. - Schedule follow-up with pharmacist in 4 weeks to assess blood pressure control and medication efficacy. - Instruct to obtain a blood pressure cuff for home monitoring.  The patient is in agreement with the above plan. The patient left the office in stable condition.  The patient will follow up in   Medication Adjustments/Labs and Tests Ordered: Current medicines are reviewed at length with the patient today.  Concerns regarding medicines are outlined above.  Orders Placed This Encounter  Procedures   AMB Referral to [facility]   EKG 12-Lead   ECHOCARDIOGRAM COMPLETE   Meds ordered this encounter  Medications   Blood Pressure Monitoring (BLOOD PRESSURE MONITOR AUTOMAT) DEVI    Sig: Use to monitor blood pressure as directed.    Dispense:  1 each    Refill:  0   amLODipine  (NORVASC ) 5 MG tablet    Sig: Take 1 tablet (5 mg total) by mouth daily.    Dispense:  90 tablet    Refill:  3    Patient Instructions  Medication Instructions:   Your physician has recommended you make the following change in your medication:  START: Amlodipine  5 mg once daily *If you need a refill on your cardiac medications before your next appointment, please call your pharmacy*   Testing/Procedures: Your physician has requested that you have an echocardiogram. Echocardiography is a painless test that uses sound waves to create images of your heart. It provides your doctor with information about the size and shape of your heart and how well your heart's chambers and valves are working. This procedure takes approximately one hour. There are no restrictions for this procedure. Please do NOT wear cologne, perfume, aftershave, or lotions (deodorant is allowed). Please arrive 15 minutes prior to your appointment time.  Please note: We ask at that you not bring children with you during ultrasound (echo/ vascular) testing. Due to room size and safety concerns, children are not allowed in the ultrasound rooms during exams. Our front office staff cannot provide observation of children in our lobby area while testing is being conducted. An adult accompanying a patient to their appointment will only be allowed in the ultrasound room at the discretion of the ultrasound technician under special circumstances. We apologize for any inconvenience.   Follow-Up: At [facility], you and your health needs are our priority.  As part of our continuing mission to provide you with exceptional heart care, our providers are all part of one team.  This team includes your primary Cardiologist (physician) and Advanced Practice Providers or APPs (Physician Assistants and Nurse Practitioners) who all work together to provide you with the care you need, when you need it.  Your next appointment:   1 year(s)  Provider:   Abigal Choung, DO    We recommend signing up for the patient portal called MyChart.  Sign up information is provided on this After Visit Summary.   MyChart is used to connect with patients for Virtual Visits (Telemedicine).  Patients are able to view lab/test results, encounter notes, upcoming appointments, etc.  Non-urgent messages can be sent to your provider as well.   To learn more about what you can do with MyChart, go to [email or web address].   Other Instructions Please see our pharmacist in 4 weeks.  Adopting a Healthy Lifestyle.  Know what a healthy weight is for you (roughly BMI <25) and aim to maintain this   Aim for 7+ servings of fruits and vegetables daily   65-80+ fluid ounces of water or unsweet tea for healthy kidneys   Limit to max 1 drink of alcohol per day; avoid smoking/tobacco   Limit animal fats in diet for cholesterol and heart health - choose grass fed whenever available   Avoid highly processed foods, and foods high in saturated/trans fats   Aim for low stress - take time to unwind and care for your mental health   Aim for 150 min of moderate intensity exercise weekly for heart health, and weights twice weekly for bone health   Aim for 7-9 hours of sleep daily   When it comes to diets, agreement about the perfect plan isnt easy to find, even among the experts. Experts at the [facility] [employer] developed an idea known as the Healthy Eating Plate. Just imagine a plate divided into logical, healthy portions.   The emphasis is on diet quality:   Load up on vegetables and fruits - one-half of your plate: Aim for color and variety, and remember that potatoes dont count.   Go for whole grains - one-quarter of your plate: Whole wheat, barley, wheat berries, quinoa, oats, brown rice, and foods made with them. If you want pasta, go with whole wheat pasta.   Protein power - one-quarter of your plate: Fish, chicken, beans, and nuts are all healthy, versatile protein sources. Limit red meat.   The diet, however, does go beyond the plate, offering a few other suggestions.   Use healthy  plant oils, such as olive, canola, soy, corn, sunflower and peanut. Check the labels, and avoid partially hydrogenated oil, which have unhealthy trans fats.   If youre thirsty, drink water. Coffee and tea are good in moderation, but skip sugary drinks and limit milk and dairy products to one or two daily servings.   The type of carbohydrate in the diet is more important than the amount. Some sources of carbohydrates, such as vegetables, fruits, whole grains, and beans-are healthier than others.   Finally, stay active  Signed, Dub Huntsman, DO  09/10/2023 9:20 PM    [facility]

## 2023-09-20 ENCOUNTER — Other Ambulatory Visit (HOSPITAL_COMMUNITY): Payer: Self-pay

## 2023-09-23 ENCOUNTER — Other Ambulatory Visit: Payer: Self-pay

## 2023-09-23 MED ORDER — AMLODIPINE BESYLATE 5 MG PO TABS: 5.0000 mg | ORAL_TABLET | Freq: Every day | ORAL | 3 refills | Status: AC

## 2023-10-18 ENCOUNTER — Ambulatory Visit (HOSPITAL_COMMUNITY)
Admission: RE | Admit: 2023-10-18 | Discharge: 2023-10-18 | Disposition: A | Discharge status: Home / Self Care | Source: Ambulatory Visit | Attending: Cardiovascular Disease | Admitting: Cardiovascular Disease

## 2023-10-18 DIAGNOSIS — Z8774 Personal history of (corrected) congenital malformations of heart and circulatory system: Secondary | ICD-10-CM | POA: Insufficient documentation

## 2023-10-18 DIAGNOSIS — I10 Essential (primary) hypertension: Secondary | ICD-10-CM

## 2023-10-18 LAB — ECHOCARDIOGRAM COMPLETE
AV Mean grad: 16 mmHg
AV Peak grad: 28.3 mmHg
Ao pk vel: 2.66 m/s
Area-P 1/2: 3.17 cm2
P 1/2 time: 370 ms
S' Lateral: 2.3 cm

## 2023-10-19 ENCOUNTER — Other Ambulatory Visit: Payer: Self-pay | Admitting: Cardiology

## 2023-10-19 DIAGNOSIS — I4891 Unspecified atrial fibrillation: Secondary | ICD-10-CM

## 2023-10-25 ENCOUNTER — Ambulatory Visit: Payer: Self-pay | Admitting: Pharmacist

## 2023-10-25 ENCOUNTER — Ambulatory Visit: Attending: Cardiology | Admitting: Pharmacist

## 2023-10-25 VITALS — BP 112/80 | HR 91

## 2023-10-25 DIAGNOSIS — I10 Essential (primary) hypertension: Secondary | ICD-10-CM | POA: Diagnosis present

## 2023-10-25 DIAGNOSIS — I824Y9 Acute embolism and thrombosis of unspecified deep veins of unspecified proximal lower extremity: Secondary | ICD-10-CM

## 2023-10-25 DIAGNOSIS — I4891 Unspecified atrial fibrillation: Secondary | ICD-10-CM

## 2023-10-25 LAB — POCT INR: INR: 2.7 (ref 2.0–3.0)

## 2023-10-25 MED ORDER — BLOOD PRESSURE MONITOR AUTOMAT DEVI: 1.0000 | Freq: Every day | 0 refills | Status: AC

## 2023-10-25 NOTE — Progress Notes (Signed)
 INR 2.7 Continue taking 1 tablet daily EXCEPT 1/2 tablet on Mondays and Fridays.

## 2023-10-25 NOTE — Assessment & Plan Note (Signed)
 Assessment: Blood pressure essentially at goal today No home readings available Patient complaining of feeling a little woozy after taking her medication Does cook with salt.  Reports she will be more mindful Only 1 cup of coffee a day Some exercise but encouraged her to increase Does not have home blood pressure cuff.  Will send prescription to [facility] to see if her Medicaid will pay  Plan: Continue amlodipine  5 mg daily-I recommended she try taking her amlodipine  in the evening around bedtime Start checking blood pressure at home-proper technique reviewed Follow-up in 1 month-to coincide with her INR visit

## 2023-10-25 NOTE — Progress Notes (Signed)
 Patient ID: Tonya Mcintosh                 DOB: [birth year]                      MRN: [id number]      HPI: Tonya Mcintosh is a 58 y.o. female referred by Dr. Sheena to HTN clinic. 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  She saw Dr. Sheena 09/08/2023.  Blood pressure was 150/92.  Amlodipine  5 mg daily was started.  Patient presents today accompanied by her daughter for follow-up.  Has not been checking at home.  [facility].  Blood pressure was 120/80 on last check per patient.  She reports feeling a little woozy for several hours after taking medication.  Reports is not that bad.  Denies any swelling.  Occasional headache or pains in the back of her head.  Current HTN meds: amlodipine  5mg  daily Previously tried: none BP goal: <130/30  Family History:  Family History  Problem Relation Age of Onset   Asthma Sister     Social History:  Social History   Socioeconomic History   Marital status: Married    Spouse name: Not on file   Number of children: Not on file   Years of education: Not on file   Highest education level: Not on file  Occupational History   Not on file  Tobacco Use   Smoking status: Never   Smokeless tobacco: Never  Substance and Sexual Activity   Alcohol use: No   Drug use: No   Sexual activity: Yes    Partners: Male  Other Topics Concern   Not on file  Social History Narrative   Lives with ex-husband and children in [place]. No smoking, no etoh, disability   Social Drivers of Corporate investment banker Strain: Not on file  Food Insecurity: Not on file  Transportation Needs: Not on file  Physical Activity: Not on file  Stress: Not on file  Social Connections: Unknown (06/24/2021)   Received from [facility]   Social Network    Social Network: Not on file  Intimate Partner Violence: Unknown (05/29/2021)   Received from [facility]   HITS     Physically Hurt: Not on file    Insult or Talk Down To: Not on file    Threaten Physical Harm: Not on file    Scream or Curse: Not on file    Diet: 1 cup of coffee daily Adds salt  Exercise:  Walks around complex every once and awhile   Home BP readings:  Last time it was checked it was 120/80  Wt Readings from Last 3 Encounters:  09/08/23 143 lb 3.2 oz (65 kg)  07/05/22 144 lb (65.3 kg)  04/17/22 140 lb (63.5 kg)   BP Readings from Last 3 Encounters:  10/25/23 112/80  09/08/23 (!) 150/92  08/31/22 132/82   Pulse Readings from Last 3 Encounters:  10/25/23 91  09/08/23 64  08/31/22 74    Renal function: CrCl cannot be calculated (Patient's most recent lab result is older than the maximum 21 days allowed.).  Past Medical History:  Diagnosis Date   Abnormal vaginal Pap smear    CIN I   Aortic valve replaced 1987; September 2010   Endocarditis 2006   Treated w/ antibiotics long term, doing well since.  H/O insomnia    Hypercholesterolemia    Hyperlipidemia    Kidney calculi    Kidney infection    VSD (ventricular septal defect) 1979   repair    Current Outpatient Medications on File Prior to Visit  Medication Sig Dispense Refill   amLODipine  (NORVASC ) 5 MG tablet Take 1 tablet (5 mg total) by mouth daily. 90 tablet 3   warfarin (COUMADIN ) 3 MG tablet TAKE 1/2 TABLET TO 1 TABLET BY MOUTH DAILY OR AS DIRECTED BY CLINIC; 30 tablet 1   acetaminophen  (TYLENOL ) 500 MG tablet Take 1 tablet (500 mg total) by mouth every 6 (six) hours as needed for pain. 30 tablet 0   No current facility-administered medications on file prior to visit.    Allergies  Allergen Reactions   Codeine Itching    Blood pressure 112/80, pulse 91.   Assessment/Plan:     1. Hypertension -  HTN (hypertension) Assessment: Blood pressure essentially at goal today No home readings available Patient complaining of feeling a little woozy after taking her medication Does cook with  salt.  Reports she will be more mindful Only 1 cup of coffee a day Some exercise but encouraged her to increase Does not have home blood pressure cuff.  Will send prescription to [facility] to see if her Medicaid will pay  Plan: Continue amlodipine  5 mg daily-I recommended she try taking her amlodipine  in the evening around bedtime Start checking blood pressure at home-proper technique reviewed Follow-up in 1 month-to coincide with her INR visit       Thank you  Shaconda Hajduk D Gray Doering, Pharm.[name], CPP [facility] A Division of [facility] [address] [place], [place] [address]  Phone: [phone]; Fax: [phone]

## 2023-10-25 NOTE — Patient Instructions (Addendum)
 [phone]  [address] [place] [address]   Continue taking 1 tablet (warfarin) daily EXCEPT 1/2 tablet on Mondays and Fridays.   Your blood pressure goal is < 130/80mmHg   Start taking amlodipine  at bedtime instead of the morning  Check blood pressure daily at home. Please write down the readings and bring them with you to your next appointment. Follow up with [facility] about your blood pressure machine.  Important lifestyle changes to control high blood pressure  Intervention  Effect on the BP   Weight loss Weight loss is one of the most effective lifestyle changes for controlling blood pressure. If you're overweight or obese, losing even a small amount of weight can help reduce blood pressure.    Blood pressure can decrease by 1 millimeter of mercury (mmHg) with each kilogram (about 2.2 pounds) of weight lost.   Exercise regularly As a general goal, aim for 30 minutes of moderate physical activity every day.    Regular physical activity can lower blood pressure by 5 - 8 mmHg.   Eat a healthy diet Eat a diet rich in whole grains, fruits, vegetables, lean meat, and low-fat dairy products. Limit processed foods, saturated fat, and sweets.    A heart-healthy diet can lower high blood pressure by 10 mmHg.   Reduce salt (sodium) in your diet Aim for 000mg  of sodium each day. Avoid deli meats, canned food, and frozen microwave meals which are high in sodium.     Limiting sodium can reduce blood pressure by 5 mmHg.   Limit alcohol One drink equals 12 ounces of beer, 5 ounces of wine, or 1.5 ounces of 80-proof liquor.    Limiting alcohol to < 1 drink a day for women or < 2 drinks a day for men can help lower blood pressure by about 4 mmHg.   To check your pressure at home you will need to:   Sit up in a chair, with feet flat on the floor and back supported. Do not cross your ankles or legs. Rest your left arm so that the cuff is about heart level. If  the cuff goes on your upper arm, then just relax your arm on the table, arm of the chair, or your lap. If you have a wrist cuff, hold your wrist against your chest at heart level. Place the cuff snugly around your arm, about 1 inch above the crease of your elbow. The cords should be inside the groove of your elbow.  Sit quietly, with the cuff in place, for about 5 minutes. Then press the power button to start a reading. Do not talk or move while the reading is taking place.  Record your readings on a sheet of paper. Although most cuffs have a memory, it is often easier to see a pattern developing when the numbers are all in front of you.  You can repeat the reading after 1-3 minutes if it is recommended.   Make sure your bladder is empty and you have not had caffeine or tobacco within the last 30 minutes   Always bring your blood pressure log with you to your appointments. If you have not brought your monitor in to be double checked for accuracy, please bring it to your next appointment.   You can find a list of validated (accurate) blood pressure cuffs at: validatebp.org

## 2023-11-08 ENCOUNTER — Ambulatory Visit: Payer: Self-pay | Admitting: Cardiology

## 2023-11-16 ENCOUNTER — Telehealth: Payer: Self-pay | Admitting: Cardiology

## 2023-11-16 DIAGNOSIS — I4891 Unspecified atrial fibrillation: Secondary | ICD-10-CM

## 2023-11-16 MED ORDER — WARFARIN SODIUM 3 MG PO TABS
ORAL_TABLET | ORAL | 0 refills | Status: DC
Start: 2023-11-16 — End: 2023-12-15

## 2023-11-16 NOTE — Telephone Encounter (Signed)
 *  STAT* If patient is at the pharmacy, call can be transferred to refill team.   1. Which medications need to be refilled? (please list name of each medication and dose if known) warfarin (COUMADIN ) 3 MG tablet    2. Would you like to learn more about the convenience, safety, & potential cost savings by using the [facility]? No      3. Are you open to using the [facility] (Type [facility]. ). No    4. Which pharmacy/location (including street and city if local pharmacy) is medication to be sent to?[id number] [place], [place] - [address]    5. Do they need a 30 day or 90 day supply? 30 days

## 2023-11-16 NOTE — Telephone Encounter (Signed)
 Warfarin 3mg  refill Afib Last INR 10/25/23 Last OV 09/08/23

## 2023-11-24 ENCOUNTER — Ambulatory Visit: Attending: Pharmacist | Admitting: Pharmacist

## 2023-11-24 ENCOUNTER — Ambulatory Visit

## 2023-11-24 NOTE — Progress Notes (Deleted)
 Patient ID: Tonya Mcintosh                 DOB: Jul 05, 1965                      MRN: [id number]      HPI: Tonya Mcintosh is a 58 y.o. female referred by Dr. Sheena to HTN clinic. 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  She saw Dr. Sheena 09/08/2023.  Blood pressure was 150/92.  Amlodipine  5 mg daily was started.  I saw patient last 10/25/23.  BP in clinic was 112/80.  No home readings were available.  Recommended she continue to take amlodipine  5 mg daily but changed to bedtime dosing as she reported some wooziness after medication.  A prescription was sent to [facility] to see if her Medicaid will pay for a home blood pressure cuff.  INR check Did she get a home blood pressure cuff Home readings?   Patient presents today accompanied by her daughter for follow-up.  Has not been checking at home.  Occasionally will go to [facility] to check blood pressure.  Blood pressure was 120/80 on last check per patient.  She reports feeling a little woozy for several hours after taking medication.  Reports is not that bad.  Denies any swelling.  Occasional headache or pains in the back of her head.  Current HTN meds: amlodipine  5mg  daily Previously tried: none BP goal: <130/30  Family History:  Family History  Problem Relation Age of Onset   Asthma Sister     Social History:  Social History   Socioeconomic History   Marital status: Married    Spouse name: Not on file   Number of children: Not on file   Years of education: Not on file   Highest education level: Not on file  Occupational History   Not on file  Tobacco Use   Smoking status: Never   Smokeless tobacco: Never  Substance and Sexual Activity   Alcohol use: No   Drug use: No   Sexual activity: Yes    Partners: Male  Other Topics Concern   Not on file  Social History Narrative   Lives with ex-husband and children in [place]. No smoking, no etoh, disability    Social Drivers of Corporate investment banker Strain: Not on file  Food Insecurity: Not on file  Transportation Needs: Not on file  Physical Activity: Not on file  Stress: Not on file  Social Connections: Unknown (06/24/2021)   [facility]   Social Network    Social Network: Not on file  Intimate Partner Violence: Unknown (05/29/2021)   Received from [facility]   HITS    Physically Hurt: Not on file    Insult or Talk Down To: Not on file    Threaten Physical Harm: Not on file    Scream or Curse: Not on file    Diet: 1 cup of coffee daily Adds salt  Exercise:  Walks around complex every once and awhile   Home BP readings:  Last time it was checked it was 120/80  Wt Readings from Last 3 Encounters:  09/08/23 143 lb 3.2 oz (65 kg)  07/05/22 144 lb (65.3 kg)  04/17/22 140 lb (63.5 kg)   BP Readings from Last 3 Encounters:  10/25/23 112/80  09/08/23 (!) 150/92  08/31/22 132/82   Pulse Readings from Last  3 Encounters:  10/25/23 91  09/08/23 64  08/31/22 74    Renal function: CrCl cannot be calculated (Patient's most recent lab result is older than the maximum 21 days allowed.).  Past Medical History:  Diagnosis Date   Abnormal vaginal Pap smear    CIN I   Aortic valve replaced 1987; September 2010   Endocarditis 2006   Treated w/ antibiotics long term, doing well since.   H/O insomnia    Hypercholesterolemia    Hyperlipidemia    Kidney calculi    Kidney infection    VSD (ventricular septal defect) 1979   repair    Current Outpatient Medications on File Prior to Visit  Medication Sig Dispense Refill   acetaminophen  (TYLENOL ) 500 MG tablet Take 1 tablet (500 mg total) by mouth every 6 (six) hours as needed for pain. 30 tablet 0   amLODipine  (NORVASC ) 5 MG tablet Take 1 tablet (5 mg total) by mouth daily. 90 tablet 3   Blood Pressure Monitoring (BLOOD PRESSURE MONITOR AUTOMAT) DEVI 1 each by Does not apply route daily at 6 (six) AM. 1 each  0   warfarin (COUMADIN ) 3 MG tablet TAKE 1/2 TABLET TO 1 TABLET BY MOUTH DAILY OR AS DIRECTED BY CLINIC; 30 tablet 0   No current facility-administered medications on file prior to visit.    Allergies  Allergen Reactions   Codeine Itching    There were no vitals taken for this visit.   Assessment/Plan: No BP recorded.  {Refresh Note OR Click here to enter BP  :1}***   1. Hypertension -  No problem-specific Assessment & Plan notes found for this encounter.       Thank you  Chad Donoghue D [name], Pharm.[name], CPP [facility] of [facility] [address] [place], [place] [address]  Phone: [phone]; Fax: [phone]

## 2023-12-02 ENCOUNTER — Ambulatory Visit: Attending: Cardiology

## 2023-12-15 ENCOUNTER — Ambulatory Visit: Attending: Cardiology

## 2023-12-15 ENCOUNTER — Telehealth: Payer: Self-pay | Admitting: Cardiology

## 2023-12-15 ENCOUNTER — Other Ambulatory Visit: Payer: Self-pay

## 2023-12-15 DIAGNOSIS — I4891 Unspecified atrial fibrillation: Secondary | ICD-10-CM | POA: Diagnosis present

## 2023-12-15 DIAGNOSIS — Z5181 Encounter for therapeutic drug level monitoring: Secondary | ICD-10-CM | POA: Insufficient documentation

## 2023-12-15 LAB — POCT INR: INR: 1.9 — AB (ref 2.0–3.0)

## 2023-12-15 MED ORDER — WARFARIN SODIUM 3 MG PO TABS: ORAL_TABLET | ORAL | 0 refills | Status: DC

## 2023-12-15 NOTE — Patient Instructions (Signed)
 Take 2 tablets today only then Continue taking 1 tablet daily EXCEPT 1/2 tablet on Mondays and Fridays.  Recheck INR in 4 weeks.  Coumadin  Clinic [phone]

## 2023-12-15 NOTE — Telephone Encounter (Signed)
 Paper Work Dropped Off: At [facility]   Date:10/232025  Location of paper: Put in Dr. Sheena Box

## 2023-12-15 NOTE — Progress Notes (Signed)
 INR 1.9 Please see anticoagulation encounter Take 2 tablets today only then Continue taking 1 tablet daily EXCEPT 1/2 tablet on Mondays and Fridays.  Recheck INR in 4 weeks.  Coumadin  Clinic [phone]

## 2023-12-15 NOTE — Telephone Encounter (Signed)
 Placed in mail folder for Dr. Sheena to review over the weekend.

## 2024-01-12 ENCOUNTER — Ambulatory Visit: Attending: Cardiology

## 2024-01-29 ENCOUNTER — Other Ambulatory Visit: Payer: Self-pay | Admitting: Cardiology

## 2024-01-29 DIAGNOSIS — I4891 Unspecified atrial fibrillation: Secondary | ICD-10-CM

## 2024-01-30 ENCOUNTER — Ambulatory Visit

## 2024-01-30 NOTE — Telephone Encounter (Signed)
 Refill request for warfarin:  Last INR was 1.9 on 12/15/23 Next INR 01/30/24 LOV was 09/08/23  Refill approved.

## 2024-01-31 ENCOUNTER — Ambulatory Visit: Attending: Cardiology

## 2024-01-31 ENCOUNTER — Other Ambulatory Visit (HOSPITAL_COMMUNITY): Payer: Self-pay

## 2024-01-31 DIAGNOSIS — Z952 Presence of prosthetic heart valve: Secondary | ICD-10-CM

## 2024-01-31 DIAGNOSIS — I824Y9 Acute embolism and thrombosis of unspecified deep veins of unspecified proximal lower extremity: Secondary | ICD-10-CM

## 2024-01-31 DIAGNOSIS — I4891 Unspecified atrial fibrillation: Secondary | ICD-10-CM

## 2024-01-31 LAB — POCT INR: INR: 2.8 (ref 2.0–3.0)

## 2024-01-31 MED ORDER — WARFARIN SODIUM 3 MG PO TABS
1.5000 mg | ORAL_TABLET | Freq: Every day | ORAL | 3 refills | Status: AC
  Filled 2024-01-31: qty 30, 30d supply, fill #0

## 2024-01-31 NOTE — Progress Notes (Signed)
 Description   INR 2.8: Continue taking 1 tablet daily EXCEPT 1/2 tablet on Mondays and Fridays.  Recheck INR in 4 weeks.  Coumadin  Clinic [phone]

## 2024-01-31 NOTE — Patient Instructions (Signed)
 Description   INR 2.8: Continue taking 1 tablet daily EXCEPT 1/2 tablet on Mondays and Fridays.  Recheck INR in 4 weeks.  Coumadin  Clinic [phone]

## 2024-02-28 ENCOUNTER — Ambulatory Visit: Admitting: *Deleted

## 2024-02-28 DIAGNOSIS — Z952 Presence of prosthetic heart valve: Secondary | ICD-10-CM | POA: Diagnosis present

## 2024-02-28 DIAGNOSIS — I4891 Unspecified atrial fibrillation: Secondary | ICD-10-CM | POA: Diagnosis present

## 2024-02-28 DIAGNOSIS — I824Y9 Acute embolism and thrombosis of unspecified deep veins of unspecified proximal lower extremity: Secondary | ICD-10-CM | POA: Insufficient documentation

## 2024-02-28 LAB — POCT INR: INR: 2.7 (ref 2.0–3.0)

## 2024-02-28 NOTE — Patient Instructions (Signed)
 Description   INR 2.7: Continue taking 1 tablet daily EXCEPT 1/2 tablet on Mondays and Fridays.  Recheck INR in 5 weeks.  Coumadin  Clinic [phone]

## 2024-02-28 NOTE — Progress Notes (Signed)
 Description   INR 2.7: Continue taking 1 tablet daily EXCEPT 1/2 tablet on Mondays and Fridays.  Recheck INR in 5 weeks.  Coumadin  Clinic [phone]

## 2024-04-03 ENCOUNTER — Ambulatory Visit
